# Patient Record
Sex: Male | Born: 1960 | Race: Black or African American | Hispanic: No | Marital: Married | Smoking: Current every day smoker
Health system: Southern US, Community
[De-identification: ages and names within clinical notes are randomized; demographics above are authoritative.]

## PROBLEM LIST (undated history)

## (undated) DIAGNOSIS — K219 Gastro-esophageal reflux disease without esophagitis: Secondary | ICD-10-CM

## (undated) DIAGNOSIS — D649 Anemia, unspecified: Secondary | ICD-10-CM

## (undated) DIAGNOSIS — I739 Peripheral vascular disease, unspecified: Secondary | ICD-10-CM

## (undated) DIAGNOSIS — Z8619 Personal history of other infectious and parasitic diseases: Secondary | ICD-10-CM

## (undated) HISTORY — DX: Peripheral vascular disease, unspecified: I73.9

## (undated) HISTORY — DX: Anemia, unspecified: D64.9

## (undated) HISTORY — DX: Gastro-esophageal reflux disease without esophagitis: K21.9

---

## 2002-10-25 HISTORY — PX: OTHER SURGICAL HISTORY: SHX169

## 2004-08-16 ENCOUNTER — Emergency Department: Payer: Self-pay | Admitting: Emergency Medicine

## 2006-04-14 ENCOUNTER — Ambulatory Visit: Payer: Self-pay | Admitting: Unknown Physician Specialty

## 2006-04-24 ENCOUNTER — Ambulatory Visit: Payer: Self-pay | Admitting: Unknown Physician Specialty

## 2006-05-25 ENCOUNTER — Ambulatory Visit: Payer: Self-pay | Admitting: Unknown Physician Specialty

## 2006-06-25 ENCOUNTER — Ambulatory Visit: Payer: Self-pay | Admitting: Unknown Physician Specialty

## 2006-12-14 ENCOUNTER — Ambulatory Visit: Payer: Self-pay | Admitting: Family Medicine

## 2006-12-23 ENCOUNTER — Ambulatory Visit: Payer: Self-pay | Admitting: Family Medicine

## 2006-12-23 LAB — CONVERTED CEMR LAB
ALT: 28 units/L (ref 0–40)
AST: 26 units/L (ref 0–37)
Albumin: 3.9 g/dL (ref 3.5–5.2)
Alkaline Phosphatase: 75 units/L (ref 39–117)
BUN: 12 mg/dL (ref 6–23)
Bilirubin, Direct: 0.1 mg/dL (ref 0.0–0.3)
CO2: 30 meq/L (ref 19–32)
Calcium: 9 mg/dL (ref 8.4–10.5)
Chloride: 100 meq/L (ref 96–112)
Cholesterol: 125 mg/dL (ref 0–200)
Creatinine, Ser: 1.1 mg/dL (ref 0.4–1.5)
Folate: 8.3 ng/mL
GFR calc Af Amer: 94 mL/min
GFR calc non Af Amer: 78 mL/min
Glucose, Bld: 78 mg/dL (ref 70–99)
HDL: 38.3 mg/dL — ABNORMAL LOW (ref >39.0)
LDL Cholesterol: 78 mg/dL (ref 0–99)
Lipase: 18 units/L (ref 11.0–59.0)
Potassium: 4 meq/L (ref 3.5–5.1)
Sodium: 135 meq/L (ref 135–145)
TSH: 0.95 microintl units/mL (ref 0.35–5.50)
Total Bilirubin: 1.1 mg/dL (ref 0.3–1.2)
Total CHOL/HDL Ratio: 3.3
Total Protein: 7.1 g/dL (ref 6.0–8.3)
Triglycerides: 42 mg/dL (ref 0–149)
VLDL: 8 mg/dL (ref 0–40)
Vitamin B-12: 216 pg/mL (ref 211–911)

## 2007-06-09 ENCOUNTER — Encounter: Payer: Self-pay | Admitting: Family Medicine

## 2007-06-09 DIAGNOSIS — K219 Gastro-esophageal reflux disease without esophagitis: Secondary | ICD-10-CM | POA: Insufficient documentation

## 2007-06-09 DIAGNOSIS — I739 Peripheral vascular disease, unspecified: Secondary | ICD-10-CM | POA: Insufficient documentation

## 2007-06-09 DIAGNOSIS — F1410 Cocaine abuse, uncomplicated: Secondary | ICD-10-CM | POA: Insufficient documentation

## 2007-06-14 ENCOUNTER — Encounter (INDEPENDENT_AMBULATORY_CARE_PROVIDER_SITE_OTHER): Payer: Self-pay | Admitting: *Deleted

## 2008-01-11 ENCOUNTER — Ambulatory Visit: Payer: Self-pay | Admitting: Family Medicine

## 2008-01-11 ENCOUNTER — Encounter (INDEPENDENT_AMBULATORY_CARE_PROVIDER_SITE_OTHER): Payer: Self-pay | Admitting: *Deleted

## 2008-10-13 ENCOUNTER — Telehealth: Payer: Self-pay | Admitting: Family Medicine

## 2008-10-13 ENCOUNTER — Emergency Department: Payer: Self-pay | Admitting: Emergency Medicine

## 2009-01-19 ENCOUNTER — Emergency Department (HOSPITAL_COMMUNITY): Admission: EM | Admit: 2009-01-19 | Discharge: 2009-01-19 | Payer: Self-pay | Admitting: Emergency Medicine

## 2009-10-28 LAB — HM COLONOSCOPY: HM Colonoscopy: NORMAL

## 2009-11-05 ENCOUNTER — Inpatient Hospital Stay: Payer: Self-pay | Admitting: Internal Medicine

## 2010-06-14 ENCOUNTER — Emergency Department: Payer: Self-pay | Admitting: Emergency Medicine

## 2010-07-26 ENCOUNTER — Ambulatory Visit: Payer: Self-pay | Admitting: Family Medicine

## 2010-10-25 HISTORY — PX: COLONOSCOPY: SHX174

## 2010-12-25 ENCOUNTER — Encounter: Payer: Self-pay | Admitting: Internal Medicine

## 2010-12-25 ENCOUNTER — Ambulatory Visit (INDEPENDENT_AMBULATORY_CARE_PROVIDER_SITE_OTHER): Payer: Managed Care, Other (non HMO) | Admitting: Internal Medicine

## 2010-12-25 ENCOUNTER — Other Ambulatory Visit: Payer: Managed Care, Other (non HMO)

## 2010-12-25 ENCOUNTER — Other Ambulatory Visit: Payer: Self-pay | Admitting: Internal Medicine

## 2010-12-25 DIAGNOSIS — R300 Dysuria: Secondary | ICD-10-CM

## 2010-12-25 DIAGNOSIS — E559 Vitamin D deficiency, unspecified: Secondary | ICD-10-CM

## 2010-12-25 DIAGNOSIS — N401 Enlarged prostate with lower urinary tract symptoms: Secondary | ICD-10-CM

## 2010-12-25 DIAGNOSIS — D649 Anemia, unspecified: Secondary | ICD-10-CM

## 2010-12-25 DIAGNOSIS — K219 Gastro-esophageal reflux disease without esophagitis: Secondary | ICD-10-CM

## 2010-12-25 DIAGNOSIS — N138 Other obstructive and reflux uropathy: Secondary | ICD-10-CM

## 2010-12-25 DIAGNOSIS — F17200 Nicotine dependence, unspecified, uncomplicated: Secondary | ICD-10-CM

## 2010-12-25 LAB — URINALYSIS, ROUTINE W REFLEX MICROSCOPIC
Bilirubin Urine: NEGATIVE
Hgb urine dipstick: NEGATIVE
Leukocytes, UA: NEGATIVE
Nitrite: NEGATIVE
Specific Gravity, Urine: 1.025 (ref 1.000–1.030)
Total Protein, Urine: NEGATIVE
Urine Glucose: NEGATIVE
Urobilinogen, UA: 0.2 (ref 0.0–1.0)
pH: 6.5 (ref 5.0–8.0)

## 2010-12-25 LAB — HEPATIC FUNCTION PANEL
ALT: 28 U/L (ref 0–53)
AST: 29 U/L (ref 0–37)
Albumin: 4.3 g/dL (ref 3.5–5.2)
Alkaline Phosphatase: 79 U/L (ref 39–117)
Bilirubin, Direct: 0.1 mg/dL (ref 0.0–0.3)
Total Bilirubin: 0.7 mg/dL (ref 0.3–1.2)
Total Protein: 7.5 g/dL (ref 6.0–8.3)

## 2010-12-25 LAB — BASIC METABOLIC PANEL
BUN: 13 mg/dL (ref 6–23)
CO2: 32 mEq/L (ref 19–32)
Calcium: 9.4 mg/dL (ref 8.4–10.5)
Chloride: 104 mEq/L (ref 96–112)
Creatinine, Ser: 1 mg/dL (ref 0.4–1.5)
GFR: 99.16 mL/min (ref >60.00)
Glucose, Bld: 89 mg/dL (ref 70–99)
Potassium: 4.3 mEq/L (ref 3.5–5.1)
Sodium: 141 mEq/L (ref 135–145)

## 2010-12-25 LAB — IBC PANEL
Iron: 116 ug/dL (ref 42–165)
Saturation Ratios: 28.7 % (ref 20.0–50.0)
Transferrin: 288.8 mg/dL (ref 212.0–360.0)

## 2010-12-25 LAB — TSH: TSH: 1.39 u[IU]/mL (ref 0.35–5.50)

## 2010-12-25 LAB — PSA: PSA: 0.67 ng/mL (ref 0.10–4.00)

## 2010-12-26 ENCOUNTER — Encounter: Payer: Self-pay | Admitting: Internal Medicine

## 2010-12-28 LAB — CBC WITH DIFFERENTIAL/PLATELET
Basophils Relative: 0.4 % (ref 0.0–3.0)
Eosinophils Relative: 1.6 % (ref 0.0–5.0)
HCT: 42.4 % (ref 39.0–52.0)
Hemoglobin: 14.3 g/dL (ref 13.0–17.0)
Lymphs Abs: 2.5 10*3/uL (ref 0.7–4.0)
Monocytes Relative: 7.3 % (ref 3.0–12.0)
Neutro Abs: 5.6 10*3/uL (ref 1.4–7.7)
WBC: 8.9 10*3/uL (ref 4.5–10.5)

## 2010-12-29 ENCOUNTER — Telehealth: Payer: Self-pay | Admitting: Internal Medicine

## 2010-12-30 ENCOUNTER — Telehealth: Payer: Self-pay | Admitting: Internal Medicine

## 2010-12-31 NOTE — Assessment & Plan Note (Signed)
Summary: NEW [employer] PT--#--PKG--STC   Vital Signs:  Patient profile:   50 year old male Height:      73 inches (185.42 cm) Weight:      180.50 pounds (82.05 kg) BMI:     23.90 O2 Sat:      98 % on Room air Temp:     98.0 degrees F (36.67 degrees C) oral Pulse rate:   66 / minute Pulse rhythm:   regular Resp:     14 per minute BP sitting:   118 / 78  (left arm) Cuff size:   large  Vitals Entered By: Burnard Leigh CMA(AAMA) (December 25, 2010 4:28 PM)  O2 Flow:  Room air CC: NP to establish.Discuss prostate & urinary issues x1 month/sls, cma, Preventive Care, Dysuria,  Is Patient Diabetic? No Pain Assessment Patient in pain? no        Primary Care Provider:  Etta Grandchild MD  CC:  NP to establish.Discuss prostate & urinary issues x1 month/sls, cma, Preventive Care, Dysuria, and .  History of Present Illness:  Dysuria      This is a 50 year old man who presents with Dysuria.  The symptoms began 4-8 weeks ago.  The intensity is described as moderate.  The patient reports burning with urination, but denies urinary frequency, urgency, hematuria, and penile discharge.  Associated symptoms include pelvic pain.  The patient denies the following associated symptoms: nausea, vomiting, fever, shaking chills, flank pain, abdominal pain, back pain, and arthralgias.  History is significant for no urinary tract problems.    Dyspepsia History:      He has no alarm features of dyspepsia including no history of melena, hematochezia, dysphagia, persistent vomiting, or involuntary weight loss > 5%.  There is a prior history of GERD.  The patient does not have a prior history of documented ulcer disease.  The dominant symptom is heartburn or acid reflux.  An H-2 blocker medication is currently being taken.  He notes that the symptoms have improved with the H-2 blocker therapy.  Symptoms have not persisted after 4 weeks of H-2 blocker treatment.  A prior EGD has been done which showed no evidence for  moderate or severe esophagitis or Barrett's esophagus.    Preventive Screening-Counseling & Management  Alcohol-Tobacco     Alcohol drinks/day: 1     Alcohol type: beer     >5/day in last 3 mos: no     Alcohol Counseling: not indicated; use of alcohol is not excessive or problematic     Feels need to cut down: no     Feels annoyed by complaints: no     Feels guilty re: drinking: no     Needs 'eye opener' in am: no     Smoking Status: current     Smoking Cessation Counseling: yes     Smoke Cessation Stage: precontemplative     Packs/Day: 0.5     Year Started: 1986     Cigars/week: 20     Tobacco Counseling: to quit use of tobacco products  Hep-HIV-STD-Contraception     Hepatitis Risk: no risk noted     HIV Risk: no risk noted     STD Risk: no risk noted     STD Risk Counseling: to avoid increased STD risk      Sexual History:  currently monogamous.        Drug Use:  former and crack cocaine.        Blood Transfusions:  yes and after 2001.    Current Medications (verified): 1)  Nexium 40 Mg Cpdr (Esomeprazole Magnesium) .... (1) Once Daily 2)  Vitamin D3 .... (1) Once Daily 3)  Vitamin B12 .... (1) Once Daily  Allergies (verified): No Known Drug Allergies  Past History:  Past Medical History: GERD Peripheral vascular disease Anemia-NOS  Past Surgical History: Reviewed history from 06/09/2007 and no changes required. 2004   Fatty tissue removed from neck  Social History: Hepatitis Risk:  no risk noted HIV Risk:  no risk noted STD Risk:  no risk noted Sexual History:  currently monogamous Drug Use:  former, crack cocaine Blood Transfusions:  yes, after 2001 Smoking Status:  current Packs/Day:  0.5  Review of Systems  The patient denies anorexia, fever, weight loss, weight gain, chest pain, syncope, dyspnea on exertion, peripheral edema, prolonged cough, headaches, hemoptysis, abdominal pain, hematuria, incontinence, genital sores, muscle weakness,  suspicious skin lesions, transient blindness, difficulty walking, depression, and enlarged lymph nodes.   GI:  Denies abdominal pain, bloody stools, change in bowel habits, constipation, dark tarry stools, diarrhea, excessive appetite, gas, hemorrhoids, indigestion, loss of appetite, nausea, vomiting, vomiting blood, and yellowish skin color. GU:  Complains of dysuria and urinary hesitancy; denies decreased libido, discharge, erectile dysfunction, genital sores, hematuria, incontinence, nocturia, and urinary frequency. Heme:  Denies abnormal bruising, bleeding, enlarge lymph nodes, fevers, pallor, and skin discoloration.  Physical Exam  General:  alert, well-developed, well-nourished, well-hydrated, appropriate dress, normal appearance, healthy-appearing, cooperative to examination, and good hygiene.   Head:  normocephalic, atraumatic, no abnormalities observed, and no abnormalities palpated.   Eyes:  vision grossly intact, pupils equal, pupils round, and pupils reactive to light.   Ears:  External ear exam shows no significant lesions or deformities.  Otoscopic examination reveals clear canals, tympanic membranes are intact bilaterally without bulging, retraction, inflammation or discharge. Hearing is grossly normal bilaterally. Nose:  External nasal examination shows no deformity or inflammation. Nasal mucosa are pink and moist without lesions or exudates. Mouth:  he has a beefy red tongue. good dentition, pharynx pink and moist, no exudates, no posterior lymphoid hypertrophy, no postnasal drip, no pharyngeal crowing, no lesions, no aphthous ulcers, no erosions, no leukoplakia, and no petechiae.   Neck:  supple, full ROM, no masses, no thyromegaly, no thyroid nodules or tenderness, no JVD, normal carotid upstroke, no carotid bruits, no cervical lymphadenopathy, and no neck tenderness.   Lungs:  normal respiratory effort, no intercostal retractions, no accessory muscle use, normal breath sounds, no  dullness, no fremitus, no crackles, and no wheezes.   Heart:  normal rate, regular rhythm, no murmur, no gallop, no rub, and no JVD.   Abdomen:  soft, non-tender, normal bowel sounds, no distention, no masses, no guarding, no rigidity, no rebound tenderness, no abdominal hernia, no inguinal hernia, no hepatomegaly, and no splenomegaly.   Rectal:  No external abnormalities noted. Normal sphincter tone. No rectal masses or tenderness. Genitalia:  circumcised, no hydrocele, no varicocele, no scrotal masses, no testicular masses or atrophy, no cutaneous lesions, and no urethral discharge.   Prostate:  no nodules, no asymmetry, no induration, boggy, and 1+ enlarged.   Msk:  No deformity or scoliosis noted of thoracic or lumbar spine.   Pulses:  R and L carotid,radial,femoral,dorsalis pedis and posterior tibial pulses are full and equal bilaterally Extremities:  No clubbing, cyanosis, edema, or deformity noted with normal full range of motion of all joints.   Neurologic:  No cranial nerve deficits noted. Station  and gait are normal. Plantar reflexes are down-going bilaterally. DTRs are symmetrical throughout. Sensory, motor and coordinative functions appear intact. Skin:  Intact without suspicious lesions or rashes Cervical Nodes:  No lymphadenopathy noted Axillary Nodes:  No palpable lymphadenopathy Inguinal Nodes:  No significant adenopathy Psych:  Cognition and judgment appear intact. Alert and cooperative with normal attention span and concentration. No apparent delusions, illusions, hallucinations   Impression & Recommendations:  Problem # 1:  DYSURIA (ICD-788.1) Assessment New this sounds like prostatitis His updated medication list for this problem includes:    Cipro 500 Mg Tabs (Ciprofloxacin hcl) ..... One by mouth two times a day for 30 days  Orders: Venipuncture (96295) TLB-B12 + Folate Pnl (28413_24401-U27/OZD) TLB-IBC Pnl (Iron/FE;Transferrin) (83550-IBC) TLB-BMP (Basic Metabolic  Panel-BMET) (80048-METABOL) TLB-CBC Platelet - w/Differential (85025-CBCD) TLB-Hepatic/Liver Function Pnl (80076-HEPATIC) TLB-TSH (Thyroid Stimulating Hormone) (84443-TSH) TLB-PSA (Prostate Specific Antigen) (84153-PSA) TLB-Udip w/ Micro (81001-URINE) T-Chlamydia  Probe, urine (66440-34742) T-GC Probe, urine [phone]) T-Urine Culture (Spectrum Order) [phone]) T-Vitamin D (25-Hydroxy) [phone])  Problem # 2:  VITAMIN D DEFICIENCY (ICD-268.9) Assessment: Unchanged  Orders: Venipuncture (09323) TLB-B12 + Folate Pnl (55732_20254-Y70/WCB) TLB-IBC Pnl (Iron/FE;Transferrin) (83550-IBC) TLB-BMP (Basic Metabolic Panel-BMET) (80048-METABOL) TLB-CBC Platelet - w/Differential (85025-CBCD) TLB-Hepatic/Liver Function Pnl (80076-HEPATIC) TLB-TSH (Thyroid Stimulating Hormone) (84443-TSH) TLB-PSA (Prostate Specific Antigen) (84153-PSA) TLB-Udip w/ Micro (81001-URINE) T-Chlamydia  Probe, urine (76283-15176) T-GC Probe, urine [phone]) T-Urine Culture (Spectrum Order) [id number]) T-Vitamin D (25-Hydroxy) ([phone])  Problem # 3:  ANEMIA-NOS (ICD-285.9) Assessment: Unchanged  Orders: Venipuncture (99371) TLB-B12 + Folate Pnl (69678_93810-F75/ZWC) TLB-IBC Pnl (Iron/FE;Transferrin) (83550-IBC) TLB-BMP (Basic Metabolic Panel-BMET) (80048-METABOL) TLB-CBC Platelet - w/Differential (85025-CBCD) TLB-Hepatic/Liver Function Pnl (80076-HEPATIC) TLB-TSH (Thyroid Stimulating Hormone) (84443-TSH) TLB-PSA (Prostate Specific Antigen) (84153-PSA) TLB-Udip w/ Micro (81001-URINE) T-Chlamydia  Probe, urine ([phone]) T-GC Probe, urine [phone]) T-Urine Culture (Spectrum Order) [phone]) T-Vitamin D (25-Hydroxy) ([phone]) Hemoccult Guaiac-1 spec.(in office) [phone])  B12: 216 (12/23/2006)   Folate: 8.3 (12/23/2006)   TSH: 0.95 (12/23/2006)  Problem # 4:  HYPERTROPHY PROSTATE W/UR OBST & OTH LUTS (ICD-600.01) Assessment: New  Orders: Venipuncture (33825) TLB-B12 +  Folate Pnl (05397_67341-P37/TKW) TLB-IBC Pnl (Iron/FE;Transferrin) (83550-IBC) TLB-BMP (Basic Metabolic Panel-BMET) (80048-METABOL) TLB-CBC Platelet - w/Differential (85025-CBCD) TLB-Hepatic/Liver Function Pnl (80076-HEPATIC) TLB-TSH (Thyroid Stimulating Hormone) (84443-TSH) TLB-PSA (Prostate Specific Antigen) (84153-PSA) TLB-Udip w/ Micro (81001-URINE) T-Chlamydia  Probe, urine (40973-53299) T-GC Probe, urine [phone]) T-Urine Culture (Spectrum Order) [phone]) T-Vitamin D (25-Hydroxy) [phone]) Tobacco use cessation intermediate 3-10 minutes (48185)  Problem # 5:  GERD (ICD-530.81) Assessment: Improved  His updated medication list for this problem includes:    Nexium 40 Mg Cpdr (Esomeprazole magnesium) ..... (1) once daily  Orders: Venipuncture (63149) TLB-B12 + Folate Pnl (70263_78588-F02/DXA) TLB-IBC Pnl (Iron/FE;Transferrin) (83550-IBC) TLB-BMP (Basic Metabolic Panel-BMET) (80048-METABOL) TLB-CBC Platelet - w/Differential (85025-CBCD) TLB-Hepatic/Liver Function Pnl (80076-HEPATIC) TLB-TSH (Thyroid Stimulating Hormone) (84443-TSH) TLB-PSA (Prostate Specific Antigen) (84153-PSA) TLB-Udip w/ Micro (81001-URINE) T-Chlamydia  Probe, urine (12878-67672) T-GC Probe, urine ([phone]) T-Urine Culture (Spectrum Order) [phone]) T-Vitamin D (25-Hydroxy) [phone]) Hemoccult Guaiac-1 spec.(in office) (82270) Tobacco use cessation intermediate 3-10 minutes (27517)  Problem # 6:  TOBACCO USE (ICD-305.1) Assessment: New  Encouraged smoking cessation and discussed different methods for smoking cessation.   Orders: Tobacco use cessation intermediate 3-10 minutes (99406)  Complete Medication List: 1)  Nexium 40 Mg Cpdr (Esomeprazole magnesium) .... (1) once daily 2)  Vitamin D3  .... (1) once daily 3)  Vitamin B12  .... (1) once daily 4)  Cipro 500 Mg Tabs (Ciprofloxacin hcl) .... One by mouth two times a day for 30 days  Colorectal  Screening:  Current  Recommendations:    Hemoccult: NEG X 1 today  Colonoscopy Results:    Date of Exam: 10/28/2009    Results: Normal  Colonoscopy Comments:    at [facility]  PSA Screening:    Reviewed PSA screening recommendations: PSA ordered   Patient Instructions: 1)  Please schedule a follow-up appointment in 1 month. 2)  Avoid foods high in acid (tomatoes, citrus juices, spicy foods). Avoid eating within two hours of lying down or before exercising. Do not over eat; try smaller more frequent meals. Elevate head of bed twelve inches when sleeping. 3)  Tobacco is very bad for your health and your loved ones! You Should stop smoking!. 4)  Stop Smoking Tips: Choose a Quit date. Cut down before the Quit date. decide what you will do as a substitute when you feel the urge to smoke(gum,toothpick,exercise). 5)  Take your antibiotic as prescribed until ALL of it is gone, but stop if you develop a rash or swelling and contact our office as soon as possible. Prescriptions: CIPRO 500 MG TABS (CIPROFLOXACIN HCL) One by mouth two times a day for 30 days  #60 x 1   Entered and Authorized by:   Etta Grandchild MD   Signed by:   Etta Grandchild MD on 12/25/2010   Method used:   Electronically to        [facility] [phone]* (retail)       [address] [place], [place]  [address]       Ph: [phone] or [phone]       Fax: [phone]   RxID:   [id number]    Orders Added: 1)  Venipuncture [34742] 2)  TLB-B12 + Folate Pnl [82746_82607-B12/FOL] 3)  TLB-IBC Pnl (Iron/FE;Transferrin) [83550-IBC] 4)  TLB-BMP (Basic Metabolic Panel-BMET) [80048-METABOL] 5)  TLB-CBC Platelet - w/Differential [85025-CBCD] 6)  TLB-Hepatic/Liver Function Pnl [80076-HEPATIC] 7)  TLB-TSH (Thyroid Stimulating Hormone) [84443-TSH] 8)  TLB-PSA (Prostate Specific Antigen) [84153-PSA] 9)  TLB-Udip w/ Micro [81001-URINE] 10)  T-Chlamydia  Probe, urine [phone] 11)  T-GC Probe, urine [phone] 12)   T-Urine Culture (Spectrum Order) [66063-01601] 13)  T-Vitamin D (25-Hydroxy) [phone] 14)  Hemoccult Guaiac-1 spec.(in office) [82270] 15)  Tobacco use cessation intermediate 3-10 minutes [99406] 16)  New Patient Level V [99205]

## 2011-01-05 NOTE — Progress Notes (Signed)
Summary: Med ?  Phone Note Call from Patient Call back at [facility] Phone [phone]   Summary of Call: Male called for pt - req a call, has question about med MD prescribed. Initial call taken by: Lamar Sprinkles, CMA,  December 29, 2010 4:17 PM  Follow-up for Phone Call        Spoke w/pt 1.Pt c/o loose yellow stools 1 - 2 daily and rectal itching. Started antibiotic on Friday. Pt thinks symptoms are from new med.  2. Req results of labs. Follow-up by: Lamar Sprinkles, CMA,  December 29, 2010 6:20 PM  Additional Follow-up for Phone Call Additional follow up Details #1::        vitamin d level is low other labs look good stop antibiotic and follow-up Additional Follow-up by: Etta Grandchild MD,  December 29, 2010 6:24 PM    Additional Follow-up for Phone Call Additional follow up Details #2::    Pt informed  Follow-up by: Lamar Sprinkles, CMA,  December 30, 2010 4:14 PM

## 2011-01-05 NOTE — Progress Notes (Signed)
  Phone Note Call from Patient   Summary of Call: Pt is concerned why MD stopped antibiotic. Culture showed insignifigant growth so he does not need any further meds correct?  Initial call taken by: Lamar Sprinkles, CMA,  December 30, 2010 5:02 PM  Follow-up for Phone Call        antibiotic was stopped due to side effects, his urine did not show any signs of infection Follow-up by: Etta Grandchild MD,  December 30, 2010 5:07 PM  Additional Follow-up for Phone Call Additional follow up Details #1::        Pt informed  Additional Follow-up by: Lamar Sprinkles, CMA,  December 30, 2010 5:27 PM

## 2011-01-27 ENCOUNTER — Ambulatory Visit: Payer: Managed Care, Other (non HMO) | Admitting: Internal Medicine

## 2011-01-27 DIAGNOSIS — Z0289 Encounter for other administrative examinations: Secondary | ICD-10-CM

## 2011-03-12 NOTE — Assessment & Plan Note (Signed)
[facility]                           [place] OFFICE NOTE   Donald Austin, Donald Austin                         MRN:          [id number]  DATE:12/14/2006                            DOB:          [birth year]    CHIEF COMPLAINT:  A 50 year old black male here to establish new doctor.   HISTORY OF PRESENT ILLNESS:  Donald Austin states that he had been feeling  well previously until about 2-3 months ago when he began having dry  mouth and some increased thirst.  He is concerned about the way his  inner mouth looks and about the large taste buds at the base of his  tongue.  He denies polyuria.  He does also have some fatigue and  generalized weakness over the past few months.   He has had some chronic issues as well with abdominal soreness over the  epigastric region.  He has symptoms of heartburn and frequent belching.  He states his epigastric pain is worse with onions and orange juice.  He  does state he has a history of peptic ulcer disease.  He denies any  blood in his stool.  He denies any unexpected weight loss or night  sweats.   REVIEW OF SYSTEMS:  Otherwise negative.   PAST MEDICAL HISTORY:  1. Gastroesophageal reflux disease.  2. Peptic ulcer disease.  3. Crack dependence up until June 2007.   HOSPITALIZATIONS, SURGERY, AND PROCEDURES:  In 2004 fatty tissue removed  from neck, possible lipoma.   ALLERGIES:  NONE.   MEDICATIONS:  None.   SOCIAL HISTORY:  He is a former smoker with 20 pack/year history and  states he quit 4 days ago.  He occasionally drinks alcohol, 3-4 twelve  ounce beers on the weekends.  He has used crack cocaine in the past, but  states he only used injected drugs once.  He currently works in Financial planner at [employer].  He is married, he has 3 healthy children.  He  does not get regular exercise.  He avoids fried foods and usually eats  baked foods as well as fruits and vegetables.   FAMILY HISTORY:  Father deceased  at age 67 with hypertension and kidney  failure.  Mother alive at age 57 with hypertension.  No family history  of heart attack before 48.  He has one sister who had breast cancer at  age 75 and is now recovering.  There is no other type of cancer in the  family.   PHYSICAL EXAMINATION:  VITAL SIGNS:  Height 73.5 inches, weight 179.2,  blood pressure 122/90, pulse 80, temperature 98.1.  GENERAL:  Healthy appearing male in no apparent distress.  HEENT:  PERRLA, extraocular muscles intact, oropharynx clear, mucus  membranes moist, normal salivary gland openings, normal taste buds,  there is some slight geographic tongue as well as some bite marks on the  sides of his mouth near his molars.  He does have poor dentition and a  broken tooth on the right posterior side of his mouth.  Nares clear,  tympanic membranes clear, no thyromegaly, no  lymphadenopathy  supraclavicular or cervical.  CARDIOVASCULAR:  Regular rate and rhythm, no murmurs, rubs, or gallops.  ABDOMEN:  Soft, mild epigastric tenderness to palpation but otherwise  nontender.  Normoactive bowel sounds, no hepatosplenomegaly.  MUSCULOSKELETAL: Strength 5/5 in upper and lower extremities.  NEURO:  Cranial nerves II-XII grossly intact, alert and oriented x4.   ASSESSMENT/PLAN:  1. Dry mouth, increased thirst:  We will evaluate him for diabetes      given his symptoms.  I currently do not see any worrisome oral      lesions except for some areas where he is biting the sides of his      mouth.  We can followup on these at the next visit.  2. Fatigue:  His fatigue has been ongoing for 2-3 months.  I will      check a B-12 folate electrolyte panel as well as thyroid function.  3. Epigastric pain:  This is most likely secondary to gastritis versus      gastroesophageal reflux disease.  There is a possibility there is      peptic ulcer disease and he may need gastrointestinal workup.  At      this point in time I will evaluate with  liver function, lipase, as      well as the previously mentioned labs.  He will start taking      Prilosec 40 mg daily.  He will return in approximately 2-3 weeks to      follow up.  He will also avoid foods that irritate his stomach      until that point in time.  4. Prevention:  He is due for a cholesterol screen which we will have      him return for fasting with the other labs.  He is Unsure as to      when last tetanus was done.  We did discuss continuing smoking      cessation and he was encouraged to do so.  We can talk about any      medications if he restarts smoking.  He was encouraged to get      regular exercise, at least 3 days times a week for 30 minutes at a      time.  He was encouraged to avoid restarting crack.  Given his one      use of injected drugs, we can consider checking for HIV and      hepatitis at a later date.     Kerby Nora, MD  Electronically Signed    AB/MedQ  DD: 12/15/2006  DT: 12/15/2006  Job #: [id number]

## 2011-05-06 ENCOUNTER — Encounter: Payer: Self-pay | Admitting: Internal Medicine

## 2011-05-13 ENCOUNTER — Ambulatory Visit: Payer: Managed Care, Other (non HMO) | Admitting: Internal Medicine

## 2011-05-13 DIAGNOSIS — Z0289 Encounter for other administrative examinations: Secondary | ICD-10-CM

## 2011-07-03 ENCOUNTER — Emergency Department: Payer: Self-pay | Admitting: Internal Medicine

## 2011-12-14 ENCOUNTER — Encounter: Payer: Managed Care, Other (non HMO) | Admitting: Family Medicine

## 2012-01-27 ENCOUNTER — Ambulatory Visit: Payer: Managed Care, Other (non HMO) | Admitting: Internal Medicine

## 2012-03-21 ENCOUNTER — Ambulatory Visit: Payer: Managed Care, Other (non HMO) | Admitting: Internal Medicine

## 2012-03-21 DIAGNOSIS — Z0289 Encounter for other administrative examinations: Secondary | ICD-10-CM

## 2012-11-22 ENCOUNTER — Ambulatory Visit: Payer: Managed Care, Other (non HMO) | Admitting: Internal Medicine

## 2013-11-19 ENCOUNTER — Ambulatory Visit: Payer: Managed Care, Other (non HMO) | Admitting: Internal Medicine

## 2013-11-19 DIAGNOSIS — Z0289 Encounter for other administrative examinations: Secondary | ICD-10-CM

## 2015-06-05 ENCOUNTER — Ambulatory Visit (INDEPENDENT_AMBULATORY_CARE_PROVIDER_SITE_OTHER): Payer: Managed Care, Other (non HMO) | Admitting: Cardiovascular Disease

## 2015-06-05 ENCOUNTER — Encounter: Payer: Managed Care, Other (non HMO) | Admitting: Cardiovascular Disease

## 2015-06-05 ENCOUNTER — Encounter: Payer: Self-pay | Admitting: Cardiovascular Disease

## 2015-06-05 VITALS — BP 142/88 | HR 63 | Ht 74.0 in | Wt 181.0 lb

## 2015-06-05 DIAGNOSIS — I739 Peripheral vascular disease, unspecified: Secondary | ICD-10-CM

## 2015-06-05 DIAGNOSIS — M79606 Pain in leg, unspecified: Secondary | ICD-10-CM

## 2015-06-05 NOTE — Progress Notes (Signed)
06/05/2015 Donald Austin   [birth year]  [id number]  Primary Physician Scarlette Calico, MD Primary Cardiologist: Lorretta Harp MD Renae Gloss   HPI:  Donald Austin is a 54 year old thin-appearing married African-American male father of 3 children who works at [employer]. His cardiac risk factor profile is notable for tobacco abuse smoking one half pack a day for last 20+ years. Does drink 2 beers a day. He has no other risk factors for cardiovascular disease. He has never had a heart attack or stroke. He denies chest pain but does get some dyspnea on exertion. He saw his podiatrist, Dr. Melony Overly , because of pain and she referred him here for peripheral vascular evaluation because of absent pedal pulses.   Current Outpatient Prescriptions  Medication Sig Dispense Refill  . Cholecalciferol (VITAMIN D-3 PO) Take 1 tablet by mouth daily.      Marland Kitchen esomeprazole (NEXIUM) 40 MG capsule Take 40 mg by mouth daily.      . vitamin B-12 (CYANOCOBALAMIN) 1000 MCG tablet Take 1,000 mcg by mouth daily.       No current facility-administered medications for this visit.    Not on File  Social History   Social History  . Marital Status: Married    Spouse Name: N/A  . Number of Children: 3  . Years of Education: N/A   Occupational History  . Tour manager   Social History Main Topics  . Smoking status: Current Every Day Smoker -- 0.50 packs/day    Types: Cigarettes  . Smokeless tobacco: Not on file     Comment: quit 4 days ago, 20pyh  . Alcohol Use: 3.6 oz/week    6 Standard drinks or equivalent per week     Comment: occasionally (3-4 12oz./weekends)  . Drug Use: No     Comment: no inj. drugs  . Sexual Activity: Not on file   Other Topics Concern  . Not on file   Social History Narrative   Regular exercise--no   Children 3 healthy   Diet: Avoids fried food, baked, occ. Fruits and veggies     Review of Systems: General: negative for chills,  fever, night sweats or weight changes.  Cardiovascular: negative for chest pain, dyspnea on exertion, edema, orthopnea, palpitations, paroxysmal nocturnal dyspnea or shortness of breath Dermatological: negative for rash Respiratory: negative for cough or wheezing Urologic: negative for hematuria Abdominal: negative for nausea, vomiting, diarrhea, bright red blood per rectum, melena, or hematemesis Neurologic: negative for visual changes, syncope, or dizziness All other systems reviewed and are otherwise negative except as noted above.    Blood pressure 142/88, pulse 63, height 6\' 2"  (1.88 m), weight 181 lb (82.101 kg).  General appearance: alert and no distress Neck: no adenopathy, no carotid bruit, no JVD, supple, symmetrical, trachea midline and thyroid not enlarged, symmetric, no tenderness/mass/nodules Lungs: clear to auscultation bilaterally Heart: regular rate and rhythm, S1, S2 normal, no murmur, click, rub or gallop Extremities: extremities normal, atraumatic, no cyanosis or edema and 2+ pedal pulses  EKG normal sinus rhythm at 63 with a ST or T-wave changes. I personally reviewed this EKG  ASSESSMENT AND PLAN:   Peripheral vascular disease Donald Austin was referred to me by Dr. Melony Overly for peripheral arterial disease. He complains of foot pain. Dr. Melony Overly did not feel pedal pulses and referred him here for further evaluation.he denies claudication. He has 2+ pedal pulses on exam. I'm going to get lower extremity ABIs.  Lorretta Harp MD FACP,FACC,FAHA, [facility] 06/05/2015 5:31 PM

## 2015-06-05 NOTE — Patient Instructions (Signed)
  We will see you back in follow up as needed.   Dr Gwenlyn Found has ordered: 1. ABI dopplers

## 2015-06-05 NOTE — Assessment & Plan Note (Signed)
Donald Austin was referred to me by Dr. Melony Overly for peripheral arterial disease. He complains of foot pain. Dr. Melony Overly did not feel pedal pulses and referred him here for further evaluation.he denies claudication. He has 2+ pedal pulses on exam. I'm going to get lower extremity ABIs.

## 2015-06-17 ENCOUNTER — Ambulatory Visit (HOSPITAL_COMMUNITY)
Admission: RE | Admit: 2015-06-17 | Discharge: 2015-06-17 | Disposition: A | Discharge status: Home / Self Care | Payer: Managed Care, Other (non HMO) | Source: Ambulatory Visit | Attending: Cardiovascular Disease | Admitting: Cardiovascular Disease

## 2015-06-17 DIAGNOSIS — R531 Weakness: Secondary | ICD-10-CM | POA: Insufficient documentation

## 2015-06-17 DIAGNOSIS — I10 Essential (primary) hypertension: Secondary | ICD-10-CM | POA: Insufficient documentation

## 2015-06-17 DIAGNOSIS — M79606 Pain in leg, unspecified: Secondary | ICD-10-CM | POA: Diagnosis not present

## 2015-06-23 ENCOUNTER — Encounter: Payer: Self-pay | Admitting: *Deleted

## 2015-07-14 ENCOUNTER — Encounter: Payer: Self-pay | Admitting: Emergency Medicine

## 2015-07-14 ENCOUNTER — Emergency Department
Admission: EM | Admit: 2015-07-14 | Discharge: 2015-07-15 | Disposition: A | Discharge status: Other Acute Inpatient Hospital | Payer: Managed Care, Other (non HMO) | Attending: Emergency Medicine | Admitting: Emergency Medicine

## 2015-07-14 DIAGNOSIS — Z720 Tobacco use: Secondary | ICD-10-CM | POA: Diagnosis not present

## 2015-07-14 DIAGNOSIS — F4321 Adjustment disorder with depressed mood: Secondary | ICD-10-CM | POA: Diagnosis not present

## 2015-07-14 DIAGNOSIS — Z79899 Other long term (current) drug therapy: Secondary | ICD-10-CM | POA: Diagnosis not present

## 2015-07-14 DIAGNOSIS — F1020 Alcohol dependence, uncomplicated: Secondary | ICD-10-CM

## 2015-07-14 DIAGNOSIS — F329 Major depressive disorder, single episode, unspecified: Secondary | ICD-10-CM | POA: Insufficient documentation

## 2015-07-14 DIAGNOSIS — F32A Depression, unspecified: Secondary | ICD-10-CM

## 2015-07-14 LAB — COMPREHENSIVE METABOLIC PANEL
ALBUMIN: 4.5 g/dL (ref 3.5–5.0)
ALK PHOS: 84 U/L (ref 38–126)
ALT: 29 U/L (ref 17–63)
ANION GAP: 9 (ref 5–15)
AST: 31 U/L (ref 15–41)
BILIRUBIN TOTAL: 1.1 mg/dL (ref 0.3–1.2)
BUN: 12 mg/dL (ref 6–20)
CALCIUM: 9.6 mg/dL (ref 8.9–10.3)
CO2: 26 mmol/L (ref 22–32)
Chloride: 103 mmol/L (ref 101–111)
Creatinine, Ser: 1.05 mg/dL (ref 0.61–1.24)
GFR calc Af Amer: >60 mL/min (ref >60)
GFR calc non Af Amer: >60 mL/min (ref >60)
GLUCOSE: 107 mg/dL — AB (ref 65–99)
Potassium: 4 mmol/L (ref 3.5–5.1)
Sodium: 138 mmol/L (ref 135–145)
TOTAL PROTEIN: 8.1 g/dL (ref 6.5–8.1)

## 2015-07-14 LAB — URINE DRUG SCREEN, QUALITATIVE (ARMC ONLY)
Amphetamines, Ur Screen: NOT DETECTED
BARBITURATES, UR SCREEN: NOT DETECTED
BENZODIAZEPINE, UR SCRN: NOT DETECTED
CANNABINOID 50 NG, UR ~~LOC~~: NOT DETECTED
Cocaine Metabolite,Ur ~~LOC~~: NOT DETECTED
MDMA (ECSTASY) UR SCREEN: NOT DETECTED
Methadone Scn, Ur: NOT DETECTED
Opiate, Ur Screen: NOT DETECTED
Phencyclidine (PCP) Ur S: NOT DETECTED
TRICYCLIC, UR SCREEN: NOT DETECTED

## 2015-07-14 LAB — CBC
HCT: 47.3 % (ref 40.0–52.0)
Hemoglobin: 16.1 g/dL (ref 13.0–18.0)
MCH: 29.8 pg (ref 26.0–34.0)
MCHC: 34 g/dL (ref 32.0–36.0)
MCV: 87.7 fL (ref 80.0–100.0)
PLATELETS: 225 10*3/uL (ref 150–440)
RBC: 5.39 MIL/uL (ref 4.40–5.90)
RDW: 12.9 % (ref 11.5–14.5)
WBC: 8 10*3/uL (ref 3.8–10.6)

## 2015-07-14 LAB — ETHANOL: Alcohol, Ethyl (B): <5 mg/dL (ref <5)

## 2015-07-14 MED ORDER — NICOTINE 10 MG IN INHA
1.0000 | RESPIRATORY_TRACT | Status: DC | PRN
  Administered 2015-07-14: 1 via RESPIRATORY_TRACT
  Filled 2015-07-14: qty 36

## 2015-07-14 MED ORDER — DIPHENHYDRAMINE HCL 25 MG PO CAPS
50.0000 mg | ORAL_CAPSULE | Freq: Once | ORAL | Status: AC
Start: 2015-07-14 — End: 2015-07-14
  Administered 2015-07-14: 50 mg via ORAL
  Filled 2015-07-14: qty 2

## 2015-07-14 NOTE — ED Notes (Signed)

## 2015-07-14 NOTE — ED Notes (Signed)
Meal tray given 

## 2015-07-14 NOTE — ED Provider Notes (Signed)
Time Seen: Approximately.----------------------------------------- 4:22 PM on 07/14/2015 -----------------------------------------    I have reviewed the triage notes  Chief Complaint: Depression and Alcohol Intoxication   History of Present Illness: Donald Austin is a 54 y.o. male who presents with feelings of depression with a history of alcohol abuse. Patient states that he's made recent mistakes in his personal and occupational life and is feeling a lot of depression and states he "" just wants the pain to go away "". He denies any feelings of suicide at this time he denies any hallucinations or homicidal thoughts. He states he recently had an affair and his wife has "" kicked him out of the house "". Patient states he is seeking help at this time because he "" doesn't have any place else to go "". He smokes occasional marijuana but otherwise denies any illicit drugs recently such as cocaine, heroin etc. He states he's also had some employment situation and states that he has some "" lawsuits against him "". He has some mild abdominal pain but states it's not severe and he thinks it may be due to his stomach being upset. He denies any nausea, vomiting, or diarrhea.   Past Medical History  Diagnosis Date  . GERD (gastroesophageal reflux disease)   . Anemia     NOS  . PVD (peripheral vascular disease)     Patient Active Problem List   Diagnosis Date Noted  . VITAMIN D DEFICIENCY 12/25/2010  . ANEMIA-NOS 12/25/2010  . TOBACCO USE 12/25/2010  . HYPERTROPHY PROSTATE W/UR OBST & OTH LUTS 12/25/2010  . ABUSE, COCAINE, UNSPECIFIED 06/09/2007  . Peripheral vascular disease 06/09/2007  . GERD 06/09/2007    Past Surgical History  Procedure Laterality Date  . Fatty tissue removed from neck  2004  . Colonoscopy  2012    Past Surgical History  Procedure Laterality Date  . Fatty tissue removed from neck  2004  . Colonoscopy  2012    Current Outpatient Rx  Name  Route   Sig  Dispense  Refill  . Cholecalciferol (VITAMIN D-3 PO)   Oral   Take 1 tablet by mouth daily.           Marland Kitchen esomeprazole (NEXIUM) 40 MG capsule   Oral   Take 40 mg by mouth daily.           . vitamin B-12 (CYANOCOBALAMIN) 1000 MCG tablet   Oral   Take 1,000 mcg by mouth daily.             Allergies:  Review of patient's allergies indicates no known allergies.  Family History: Family History  Problem Relation Age of Onset  . Hypertension Father   . Kidney failure Father   . Hypertension Mother   . Cancer Sister     breast  . Heart attack Neg Hx     <55  . Hypertension Sister   . Hypertension Sister   . Hypertension Brother   . Hypertension Brother   . Cancer Mother     Social History: Social History  Substance Use Topics  . Smoking status: Current Every Day Smoker -- 0.50 packs/day    Types: Cigarettes  . Smokeless tobacco: None     Comment: quit 4 days ago, 20pyh  . Alcohol Use: 3.6 oz/week    6 Standard drinks or equivalent per week     Comment: occasionally (3-4 12oz./weekends)     Review of Systems:   10 point review of systems was performed and  was otherwise negative:  Constitutional: No fever Eyes: No visual disturbances ENT: No sore throat, ear pain Cardiac: No chest pain Respiratory: No shortness of breath, wheezing, or stridor Abdomen: No abdominal pain, no vomiting, No diarrhea Endocrine: No weight loss, No night sweats Extremities: No peripheral edema, cyanosis Skin: No rashes, easy bruising Neurologic: No focal weakness, trouble with speech or swollowing Urologic: No dysuria, Hematuria, or urinary frequency   Physical Exam:  ED Triage Vitals  Enc Vitals Group     BP 07/14/15 1438 141/98 mmHg     Pulse Rate 07/14/15 1438 88     Resp 07/14/15 1438 20     Temp 07/14/15 1438 98.8 F (37.1 C)     Temp Source 07/14/15 1438 Oral     SpO2 07/14/15 1438 100 %     Weight 07/14/15 1438 175 lb (79.379 kg)     Height 07/14/15 1438 6'  (1.829 m)     Head Cir --      Peak Flow --      Pain Score 07/14/15 1439 0     Pain Loc --      Pain Edu? --      Excl. in [place]? --     General: Awake , Alert , and Oriented times 3; GCS 15 Head: Normal cephalic , atraumatic Eyes: Pupils equal , round, reactive to light Nose/Throat: No nasal drainage, patent upper airway without erythema or exudate.  Neck: Supple, Full range of motion, No anterior adenopathy or palpable thyroid masses Lungs: Clear to ascultation without wheezes , rhonchi, or rales Heart: Regular rate, regular rhythm without murmurs , gallops , or rubs Abdomen: Soft, non tender without rebound, guarding , or rigidity; bowel sounds positive and symmetric in all 4 quadrants. No organomegaly .        Extremities: 2 plus symmetric pulses. No edema, clubbing or cyanosis Neurologic: normal ambulation, Motor symmetric without deficits, sensory intact Skin: warm, dry, no rashes   Labs:   All laboratory work was reviewed including any pertinent negatives or positives listed below:  Labs Reviewed  COMPREHENSIVE METABOLIC PANEL - Abnormal; Notable for the following:    Glucose, Bld 107 (*)    All other components within normal limits  ETHANOL  CBC  URINE DRUG SCREEN, QUALITATIVE ([place])     Procedures:  Patient has been established for psychiatric consultation    ED Course:  The patient doesn't fit for involuntary commitment at this time as he frankly states he is not suicidal, homicidal, or hallucinations. Patient does need some psychiatric counseling as been encouraged to stay but at this time he is voluntary commitment. He seems to be medically cleared as he does not have any significant reproducible abdominal pain and I felt it is unlikely this was a surgical abdomen. He also has not had any alcohol the last 24 hours and was offered some Ativan which she declines at this point.   Assessment:  Depression Alcohol abuse     Plan: * Psychiatric  consultation           Daymon Larsen, MD 07/14/15 [phone]

## 2015-07-14 NOTE — ED Notes (Signed)
Pt. transfered to BHU without incident after report from. Placed in room and oriented to unit. Pt. informed that for their safety all care areas are designed for safety and monitored by security cameras at all times; and visiting hours explained to patient. Patient verbalizes understanding, and verbal contract for safety obtained.   

## 2015-07-14 NOTE — ED Notes (Signed)
Report received from [facility], pt moved to [facility] per md order.

## 2015-07-14 NOTE — ED Notes (Signed)
Pt calm and cooperative at this time, no complaints.  Will continue to monitor, awaiting to talk to psychiatrist..

## 2015-07-14 NOTE — ED Notes (Signed)
Pt to ed with c/o depression and alcohol abuse over the past few days. Pt crying at triage.  Pt reports suicidal thoughts at triage.  States " I have got to find a way out of my situation"

## 2015-07-14 NOTE — ED Notes (Signed)

## 2015-07-14 NOTE — ED Notes (Signed)
Patient resting comfortably in room. No complaints or concerns voiced. No distress or abnormal behavior noted. Will continue to monitor with security cameras. Q 15 minute rounds continue.

## 2015-07-14 NOTE — ED Notes (Signed)
ED BHU PLACEMENT JUSTIFICATION Is the patient under IVC or is there intent for IVC: No. Is the patient medically cleared: Yes.   Is there vacancy in the ED BHU: Yes.   Is the population mix appropriate for patient: Yes.   Is the patient awaiting placement in inpatient or outpatient setting: Yes.   Has the patient had a psychiatric consult: Yes.   Survey of unit performed for contraband, proper placement and condition of furniture, tampering with fixtures in bathroom, shower, and each patient room: Yes.  ; Findings:  APPEARANCE/BEHAVIOR calm, cooperative and adequate rapport can be established NEURO ASSESSMENT Orientation: time, place and person Hallucinations: No.None noted (Hallucinations) Speech: Normal Gait: normal RESPIRATORY ASSESSMENT Normal expansion.  Clear to auscultation.  No rales, rhonchi, or wheezing. CARDIOVASCULAR ASSESSMENT regular rate and rhythm, S1, S2 normal, no murmur, click, rub or gallop GASTROINTESTINAL ASSESSMENT soft, nontender, BS WNL, no r/g EXTREMITIES normal strength, tone, and muscle mass PLAN OF CARE Provide calm/safe environment. Vital signs assessed twice daily. ED BHU Assessment once each 12-hour shift. Collaborate with intake RN daily or as condition indicates. Assure the ED provider has rounded once each shift. Provide and encourage hygiene. Provide redirection as needed. Assess for escalating behavior; address immediately and inform ED provider.  Assess family dynamic and appropriateness for visitation as needed: Yes.  ; If necessary, describe findings:  Educate the patient/family about BHU procedures/visitation: Yes.  ; If necessary, describe findings:  

## 2015-07-14 NOTE — BH Assessment (Signed)
Assessment Note  Donald Austin is an 54 y.o. male. Pt states that he cheated on his wife of 56yrs. Since then he has been kicked out of the home and his children are upset with him. Pt expressed distress over current living environment (with current). Pt reported feelings of sadness, hopelessness and extreme guilt. Pt reported several additional stressors including currently being sued. Pt provided elusive responses to SI inquiries and made statements such as "I wish this was a dream", "I just want to end it", "I want to run away or something". Pt stated "I can revert back to sin". Upon seeking clarification, Pt explained that he is having urges to drink excessively and stated "its too painful to stay sober". Pt reports no hx of SI or access to weapons. Pt reports no HI. When asked about hallucinations, Pt responded " Sometimes but, I don't think I am", Pt also reported seeing "things" in his peripheral vision at times. Pt reports poor appetite and difficulty concentrating, remembering and making decisions. Pt reports daily alcohol consumption (3 24oz beers, last use Saturday 07/12/15). Pt states that he is a "social smoker" (weed) 2 or 3 "puffs" 1x/wk. Pt reports no additional drug use and no SA tx hx.   Pt referred to Psych MD for consult.   Axis I: Depression  Past Medical History:  Past Medical History  Diagnosis Date  . GERD (gastroesophageal reflux disease)   . Anemia     NOS  . PVD (peripheral vascular disease)     Past Surgical History  Procedure Laterality Date  . Fatty tissue removed from neck  2004  . Colonoscopy  2012    Family History:  Family History  Problem Relation Age of Onset  . Hypertension Father   . Kidney failure Father   . Hypertension Mother   . Cancer Sister     breast  . Heart attack Neg Hx     <55  . Hypertension Sister   . Hypertension Sister   . Hypertension Brother   . Hypertension Brother   . Cancer Mother     Social History:  reports that  he has been smoking Cigarettes.  He has been smoking about 0.50 packs per day. He does not have any smokeless tobacco history on file. He reports that he drinks about 3.6 oz of alcohol per week. He reports that he uses illicit drugs (Marijuana).  Additional Social History:  Alcohol / Drug Use Pain Medications: None Reported Prescriptions: None Reported Over the Counter: None Reported History of alcohol / drug use?: Yes Longest period of sobriety (when/how long): Not Reported Negative Consequences of Use:  (None Reported) Withdrawal Symptoms:  (None Reported) Substance #1 Name of Substance 1: Cannabis 1 - Age of First Use: Not Reported 1 - Amount (size/oz): "2 or 3 puffs" 1 - Frequency: 1x/wk 1 - Duration: Not Reported 1 - Last Use / Amount: "Saturday" (07/12/15) Substance #2 Name of Substance 2: Alcohol 2 - Age of First Use: Not reported 2 - Amount (size/oz): 3 24ox beers 2 - Frequency: daily 2 - Duration: "a long time", Pt reported  >72yr with prompting 2 - Last Use / Amount: "Saturday" (07/12/15)/ 3 24ox beers  CIWA: CIWA-Ar BP: (!) 141/98 mmHg Pulse Rate: 88 COWS:    Allergies: No Known Allergies  Home Medications:  (Not in a hospital admission)  OB/GYN Status:  No LMP for male patient.  General Assessment Data Location of Assessment: [facility] ED TTS Assessment: In system Is this  a Tele or Face-to-Face Assessment?: Face-to-Face Is this an Initial Assessment or a Re-assessment for this encounter?: Initial Assessment Marital status: Married [place] name: N/A Is patient pregnant?: No Pregnancy Status: No Living Arrangements: Other relatives (With cousing while unable to live with wife) Can pt return to current living arrangement?: Yes Admission Status: Voluntary Is patient capable of signing voluntary admission?: Yes Referral Source: Self/Family/Friend Insurance type: Materials engineer Exam ([place]) Medical Exam completed: Yes  Crisis Care Plan Living  Arrangements: Other relatives (With cousing while unable to live with wife) Name of Psychiatrist: None Name of Therapist: None  Education Status Is patient currently in school?: No Current Grade: N/A Highest grade of school patient has completed: 12th Name of school: N/A Contact person: None Reported  Risk to self with the past 6 months Suicidal Ideation:  (Unknown) Has patient been a risk to self within the past 6 months prior to admission? : No Suicidal Intent:  (Unknown) Has patient had any suicidal intent within the past 6 months prior to admission? : No Is patient at risk for suicide?:  (y) Suicidal Plan?: No Has patient had any suicidal plan within the past 6 months prior to admission? : No Access to Means: No What has been your use of drugs/alcohol within the last 12 months?: daily alcohol use, cannabis 1x/wk Previous Attempts/Gestures: No How many times?: 0 Other Self Harm Risks: guilt, Urge to drink excessively Intentional Self Injurious Behavior: None Family Suicide History: No Recent stressful life event(s): Conflict (Comment), Financial Problems, Other (Comment) (conflict within marriage & w/children, currently being sued) Persecutory voices/beliefs?: No Depression: Yes Depression Symptoms: Despondent Substance abuse history and/or treatment for substance abuse?: Yes Suicide prevention information given to non-admitted patients: Not applicable  Risk to Others within the past 6 months Homicidal Ideation: No Does patient have any lifetime risk of violence toward others beyond the six months prior to admission? : No Thoughts of Harm to Others: No Current Homicidal Intent: No Current Homicidal Plan: No Access to Homicidal Means: No Identified Victim: N/A History of harm to others?: No Assessment of Violence: None Noted Violent Behavior Description: N/A Does patient have access to weapons?: No Criminal Charges Pending?: No Does patient have a court date: No Is  patient on probation?: No  Psychosis Hallucinations: Visual ("Sometimes but, I don't think I am") Delusions: None noted  Mental Status Report Appearance/Hygiene: In scrubs Eye Contact: Fair Motor Activity: Unremarkable Speech: Soft Level of Consciousness: Alert Mood: Depressed, Anxious, Sad, Helpless, Guilty Affect: Anxious, Depressed, Sad Anxiety Level: Moderate Thought Processes: Coherent, Relevant Judgement: Unimpaired Orientation: Person, Place, Time, Situation, Appropriate for developmental age Obsessive Compulsive Thoughts/Behaviors: None  Cognitive Functioning Concentration: Fair Memory: Recent Intact, Remote Intact IQ: Average Insight: Fair Impulse Control: Fair Appetite: Poor Weight Loss:  ("I don't know" ) Weight Gain:  ("I don't know") Sleep: Decreased Total Hours of Sleep: 1 Vegetative Symptoms: None  ADLScreening [facility] Assessment Services) Patient's cognitive ability adequate to safely complete daily activities?: Yes Patient able to express need for assistance with ADLs?: Yes Independently performs ADLs?: Yes (appropriate for developmental age)  Prior Inpatient Therapy Prior Inpatient Therapy: No Prior Therapy Dates: N/A Prior Therapy Facilty/Provider(s): N/A Reason for Treatment: N/A  Prior Outpatient Therapy Prior Outpatient Therapy: No Prior Therapy Dates: N/A Prior Therapy Facilty/Provider(s): N/A Reason for Treatment: N/A Does patient have an ACCT team?: No Does patient have Intensive In-House Services?  : No Does patient have [facility] services? : No Does patient have P4CC services?:  No  ADL Screening (condition at time of admission) Patient's cognitive ability adequate to safely complete daily activities?: Yes Is the patient deaf or have difficulty hearing?: No Does the patient have difficulty seeing, even when wearing glasses/contacts?:  (Pt reports needing glasses) Does the patient have difficulty concentrating, remembering, or making  decisions?: Yes Patient able to express need for assistance with ADLs?: Yes Does the patient have difficulty dressing or bathing?: No Independently performs ADLs?: Yes (appropriate for developmental age) Does the patient have difficulty walking or climbing stairs?: No Weakness of Legs: Both (Pt reports weakness and cramping) Weakness of Arms/Hands: Both (Pt reports weakness and cramping)  Home Assistive Devices/Equipment Home Assistive Devices/Equipment: None  Therapy Consults (therapy consults require a physician order) PT Evaluation Needed: No OT Evalulation Needed: No SLP Evaluation Needed: No Abuse/Neglect Assessment (Assessment to be complete while patient is alone) Physical Abuse: Denies Verbal Abuse: Denies Sexual Abuse: Denies Exploitation of patient/patient's resources: Denies Self-Neglect: Denies Values / Beliefs Cultural Requests During Hospitalization: None Spiritual Requests During Hospitalization: None Consults Spiritual Care Consult Needed: No Social Work Consult Needed: No Regulatory affairs officer (For Healthcare) Does patient have an advance directive?: No Would patient like information on creating an advanced directive?: No - patient declined information    Additional Information 1:1 In Past 12 Months?: No CIRT Risk: No Elopement Risk: No Does patient have medical clearance?: No     Disposition:  Disposition Initial Assessment Completed for this Encounter: Yes Disposition of Patient: Referred to (Psych MD consult)  On Site Evaluation by:   Reviewed with Physician:    Fatima J Martinique 07/14/2015 6:43 PM

## 2015-07-14 NOTE — ED Notes (Addendum)

## 2015-07-14 NOTE — ED Notes (Signed)

## 2015-07-14 NOTE — ED Notes (Addendum)

## 2015-07-15 ENCOUNTER — Inpatient Hospital Stay
Admission: EM | Admit: 2015-07-15 | Discharge: 2015-07-18 | DRG: 881 | Disposition: A | Discharge status: Home / Self Care | Payer: Managed Care, Other (non HMO) | Source: Intra-hospital | Attending: Psychiatry | Admitting: Psychiatry

## 2015-07-15 DIAGNOSIS — G4700 Insomnia, unspecified: Secondary | ICD-10-CM | POA: Diagnosis present

## 2015-07-15 DIAGNOSIS — R45851 Suicidal ideations: Secondary | ICD-10-CM | POA: Diagnosis present

## 2015-07-15 DIAGNOSIS — F1020 Alcohol dependence, uncomplicated: Secondary | ICD-10-CM | POA: Diagnosis present

## 2015-07-15 DIAGNOSIS — F410 Panic disorder [episodic paroxysmal anxiety] without agoraphobia: Secondary | ICD-10-CM | POA: Diagnosis present

## 2015-07-15 DIAGNOSIS — F4321 Adjustment disorder with depressed mood: Secondary | ICD-10-CM | POA: Diagnosis present

## 2015-07-15 DIAGNOSIS — Z803 Family history of malignant neoplasm of breast: Secondary | ICD-10-CM

## 2015-07-15 DIAGNOSIS — I739 Peripheral vascular disease, unspecified: Secondary | ICD-10-CM | POA: Diagnosis present

## 2015-07-15 DIAGNOSIS — Z809 Family history of malignant neoplasm, unspecified: Secondary | ICD-10-CM

## 2015-07-15 DIAGNOSIS — Z9889 Other specified postprocedural states: Secondary | ICD-10-CM

## 2015-07-15 DIAGNOSIS — K219 Gastro-esophageal reflux disease without esophagitis: Secondary | ICD-10-CM | POA: Diagnosis present

## 2015-07-15 DIAGNOSIS — F17210 Nicotine dependence, cigarettes, uncomplicated: Secondary | ICD-10-CM | POA: Diagnosis present

## 2015-07-15 DIAGNOSIS — Z8249 Family history of ischemic heart disease and other diseases of the circulatory system: Secondary | ICD-10-CM

## 2015-07-15 DIAGNOSIS — F329 Major depressive disorder, single episode, unspecified: Secondary | ICD-10-CM

## 2015-07-15 DIAGNOSIS — F17200 Nicotine dependence, unspecified, uncomplicated: Secondary | ICD-10-CM | POA: Diagnosis present

## 2015-07-15 MED ORDER — PNEUMOCOCCAL VAC POLYVALENT 25 MCG/0.5ML IJ INJ
0.5000 mL | INJECTION | INTRAMUSCULAR | Status: AC
  Administered 2015-07-16: 0.5 mL via INTRAMUSCULAR
  Filled 2015-07-15: qty 0.5

## 2015-07-15 MED ORDER — DIPHENHYDRAMINE HCL 25 MG PO CAPS
50.0000 mg | ORAL_CAPSULE | Freq: Every evening | ORAL | Status: DC | PRN
  Administered 2015-07-15 – 2015-07-17 (×3): 50 mg via ORAL
  Filled 2015-07-15 (×3): qty 2

## 2015-07-15 MED ORDER — INFLUENZA VAC SPLIT QUAD 0.5 ML IM SUSY
0.5000 mL | PREFILLED_SYRINGE | INTRAMUSCULAR | Status: AC
  Administered 2015-07-16: 0.5 mL via INTRAMUSCULAR
  Filled 2015-07-15: qty 0.5

## 2015-07-15 MED ORDER — ALUM & MAG HYDROXIDE-SIMETH 200-200-20 MG/5ML PO SUSP: 30.0000 mL | ORAL | Status: DC | PRN

## 2015-07-15 MED ORDER — MAGNESIUM HYDROXIDE 400 MG/5ML PO SUSP: 30.0000 mL | Freq: Every day | ORAL | Status: DC | PRN

## 2015-07-15 MED ORDER — NICOTINE 10 MG IN INHA: 1.0000 | RESPIRATORY_TRACT | Status: DC | PRN

## 2015-07-15 MED ORDER — ACETAMINOPHEN 325 MG PO TABS: 650.0000 mg | ORAL_TABLET | Freq: Four times a day (QID) | ORAL | Status: DC | PRN

## 2015-07-15 NOTE — ED Notes (Signed)
ENVIRONMENTAL ASSESSMENT Potentially harmful objects out of patient reach: Yes.   Personal belongings secured: Yes.   Patient dressed in hospital provided attire only: Yes.   Plastic bags out of patient reach: Yes.   Patient care equipment (cords, cables, call bells, lines, and drains) shortened, removed, or accounted for: Yes.   Equipment and supplies removed from bottom of stretcher: Yes.   Potentially toxic materials out of patient reach: Yes.   Sharps container removed or out of patient reach: Yes.     Meal given.

## 2015-07-15 NOTE — ED Notes (Signed)

## 2015-07-15 NOTE — ED Notes (Signed)
Patient resting comfortably in room. No complaints or concerns voiced. No distress or abnormal behavior noted. Will continue to monitor with security cameras. Q 15 minute rounds continue.

## 2015-07-15 NOTE — ED Notes (Signed)
.  Patient resting comfortably in room. No complaints or concerns voiced. No distress or abnormal behavior noted. Will continue to monitor with security cameras. Q 15 minute rounds continue.

## 2015-07-15 NOTE — ED Notes (Signed)
Meal given to patient 

## 2015-07-15 NOTE — ED Notes (Signed)
Patient aware of pending admission to inpatient unit. RN answered most questions regarding admission and reassured patient. Patient currently in no acute distress. Maintained on 15 minute checks and observation by security camera for safety.

## 2015-07-15 NOTE — ED Notes (Signed)
Patient visited with daughter, supervised by patient advocate.

## 2015-07-15 NOTE — ED Notes (Signed)
Patient placed telephone call to wife.  Patient currently denies SI. He is being maintained on 15 minute checks and observation by security camera for safety.

## 2015-07-15 NOTE — Consult Note (Signed)
[facility] Face-to-Face Psychiatry Consult   Reason for Consult:  Consult for this 54 year old man with a history of alcohol abuse and recent depression. Referring Physician:  Marcelene Butte Patient Identification: Donald Austin MRN:  [id number] Principal Diagnosis: Adjustment disorder with depressed mood Diagnosis:   Patient Active Problem List   Diagnosis Date Noted  . Alcohol abuse [F10.10] 07/15/2015  . Major depression [F32.2] 07/15/2015  . Adjustment disorder with depressed mood [F43.21] 07/15/2015  . VITAMIN D DEFICIENCY [E55.9] 12/25/2010  . ANEMIA-NOS [D64.9] 12/25/2010  . TOBACCO USE [Z72.0] 12/25/2010  . 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 ABUSE, COCAINE, UNSPECIFIED [F14.10] 06/09/2007  . Peripheral vascular disease [I73.9] 06/09/2007  . GERD [K21.9] 06/09/2007    Total Time spent with patient: 1 hour  Subjective:   Donald Austin is a 54 y.o. male patient admitted with "I just don't know what I'm going to do".  HPI:  History from the patient and the chart. This 54 year old man came to the emergency room yesterday very distraught and overwhelmed at the time. He says that he is feeling very depressed and hopeless. Doesn't know how to take care of himself doesn't know what to do. His wife threw him out of the house this weekend because she discovered that he had been text Ng with another woman. He denies that there've been any sexual content to the relationship but his wife nevertheless threw him out. Patient eventually reveals that he's also been drinking heavily. Multiple 24 ounce beers a day. Sounds his wife might of been getting fed up for a while before that. Patient admits that his mood had been depressed even before this weekend. Since this weekend he's been staying at his cousin's house sleeping on the floor. He feels overwhelmed like he doesn't know which way to turn. Feels like he has no capacity to take care of himself. Patient denies any  current suicidal intent. When he came to the emergency room last night he was making some dramatic comments about wanting the pain to go away. Today although he denies suicidal intent he is still unable to come up with a coherent reasonable plan for self-care. He is not currently getting any psychiatric treatment or on any medicine.  Past psychiatric history: Denies past history of suicide attempt. Denies hospitalizations. He participated in the intensive outpatient program several years ago for his alcohol abuse.  Social history: Works at the [employer]. Has been married for 27 years. Has 3 children. Apparently his wife put him out of the house this weekend and the patient has been unable to even conceivable way to deal with the problem. He says that he has no other social assistance or backup plan.  Medical history: History of peripheral vascular disease some high blood pressure and a history of anemia and vitamin D deficiency.  Substance abuse history: No history of delirium tremens or seizures. Has had problems with cocaine abuse and marijuana and the abuse in the past which she says he's not doing currently.  Family history: Denies family history of mental health problems. HPI Elements:   Quality:  Depression and feeling hopeless and overwhelmed. Severity:  Moderate to severe with concern about suicidality. Timing:  Worse just in the last 3-4 days. Duration:  Probably has been going on for a couple weeks. Context:  Alcohol abuse and now getting thrown out of the house by his wife.  Past Medical History:  Past Medical History  Diagnosis Date  . GERD (gastroesophageal  reflux disease)   . Anemia     NOS  . PVD (peripheral vascular disease)     Past Surgical History  Procedure Laterality Date  . Fatty tissue removed from neck  2004  . Colonoscopy  2012   Family History:  Family History  Problem Relation Age of Onset  . Hypertension Father   . Kidney failure Father   .  Hypertension Mother   . Cancer Sister     breast  . Heart attack Neg Hx     <55  . Hypertension Sister   . Hypertension Sister   . Hypertension Brother   . Hypertension Brother   . Cancer Mother    Social History:  History  Alcohol Use  . 3.6 oz/week  . 6 Standard drinks or equivalent per week    Comment: occasionally (3-4 12oz./weekends)     History  Drug Use  . Yes  . Special: Marijuana    Comment: no inj. drugs    Social History   Social History  . Marital Status: Married    Spouse Name: N/A  . Number of Children: 3  . Years of Education: N/A   Occupational History  . Tour manager   Social History Main Topics  . Smoking status: Current Every Day Smoker -- 0.50 packs/day    Types: Cigarettes  . Smokeless tobacco: None     Comment: quit 4 days ago, 20pyh  . Alcohol Use: 3.6 oz/week    6 Standard drinks or equivalent per week     Comment: occasionally (3-4 12oz./weekends)  . Drug Use: Yes    Special: Marijuana     Comment: no inj. drugs  . Sexual Activity: Not Asked   Other Topics Concern  . None   Social History Narrative   Regular exercise--no   Children 3 healthy   Diet: Avoids fried food, baked, occ. Fruits and veggies   Additional Social History:    Pain Medications: None Reported Prescriptions: None Reported Over the Counter: None Reported History of alcohol / drug use?: Yes Longest period of sobriety (when/how long): Not Reported Negative Consequences of Use:  (None Reported) Withdrawal Symptoms:  (None Reported) Name of Substance 1: Cannabis 1 - Age of First Use: Not Reported 1 - Amount (size/oz): "2 or 3 puffs" 1 - Frequency: 1x/wk 1 - Duration: Not Reported 1 - Last Use / Amount: "Saturday" (07/12/15) Name of Substance 2: Alcohol 2 - Age of First Use: Not reported 2 - Amount (size/oz): 3 24ox beers 2 - Frequency: daily 2 - Duration: "a long time", Pt reported  >37yrwith prompting 2 - Last Use / Amount: "Saturday"  (07/12/15)/ 3 24ox beers                 Allergies:  No Known Allergies  Labs:  Results for orders placed or performed during the hospital encounter of 07/14/15 (from the past 48 hour(s))  Comprehensive metabolic panel     Status: Abnormal   Collection Time: 07/14/15  2:45 PM  Result Value Ref Range   Sodium 138 135 - 145 mmol/L   Potassium 4.0 3.5 - 5.1 mmol/L   Chloride 103 101 - 111 mmol/L   CO2 26 22 - 32 mmol/L   Glucose, Bld 107 (H) 65 - 99 mg/dL   BUN 12 6 - 20 mg/dL   Creatinine, Ser 1.05 0.61 - 1.24 mg/dL   Calcium 9.6 8.9 - 10.3 mg/dL   Total Protein 8.1  6.5 - 8.1 g/dL   Albumin 4.5 3.5 - 5.0 g/dL   AST 31 15 - 41 U/L   ALT 29 17 - 63 U/L   Alkaline Phosphatase 84 38 - 126 U/L   Total Bilirubin 1.1 0.3 - 1.2 mg/dL   GFR calc non Af Amer >60 >60 mL/min   GFR calc Af Amer >60 >60 mL/min    Comment: (NOTE) The eGFR has been calculated using the CKD EPI equation. This calculation has not been validated in all clinical situations. eGFR's persistently <60 mL/min signify possible Chronic Kidney Disease.    Anion gap 9 5 - 15  Ethanol (ETOH)     Status: None   Collection Time: 07/14/15  2:45 PM  Result Value Ref Range   Alcohol, Ethyl (B) <5 <5 mg/dL    Comment:        LOWEST DETECTABLE LIMIT FOR SERUM ALCOHOL IS 5 mg/dL FOR MEDICAL PURPOSES ONLY   CBC     Status: None   Collection Time: 07/14/15  2:45 PM  Result Value Ref Range   WBC 8.0 3.8 - 10.6 K/uL   RBC 5.39 4.40 - 5.90 MIL/uL   Hemoglobin 16.1 13.0 - 18.0 g/dL   HCT 47.3 40.0 - 52.0 %   MCV 87.7 80.0 - 100.0 fL   MCH 29.8 26.0 - 34.0 pg   MCHC 34.0 32.0 - 36.0 g/dL   RDW 12.9 11.5 - 14.5 %   Platelets 225 150 - 440 K/uL  Urine Drug Screen, Qualitative ([facility] only)     Status: None   Collection Time: 07/14/15  2:46 PM  Result Value Ref Range   Tricyclic, Ur Screen NONE DETECTED NONE DETECTED   Amphetamines, Ur Screen NONE DETECTED NONE DETECTED   MDMA (Ecstasy)Ur Screen NONE DETECTED NONE  DETECTED   Cocaine Metabolite,Ur [place] NONE DETECTED NONE DETECTED   Opiate, Ur Screen NONE DETECTED NONE DETECTED   Phencyclidine (PCP) Ur S NONE DETECTED NONE DETECTED   Cannabinoid 50 Ng, Ur [place] NONE DETECTED NONE DETECTED   Barbiturates, Ur Screen NONE DETECTED NONE DETECTED   Benzodiazepine, Ur Scrn NONE DETECTED NONE DETECTED   Methadone Scn, Ur NONE DETECTED NONE DETECTED    Comment: (NOTE) 403  Tricyclics, urine               Cutoff 1000 ng/mL 200  Amphetamines, urine             Cutoff 1000 ng/mL 300  MDMA (Ecstasy), urine           Cutoff 500 ng/mL 400  Cocaine Metabolite, urine       Cutoff 300 ng/mL 500  Opiate, urine                   Cutoff 300 ng/mL 600  Phencyclidine (PCP), urine      Cutoff 25 ng/mL 700  Cannabinoid, urine              Cutoff 50 ng/mL 800  Barbiturates, urine             Cutoff 200 ng/mL 900  Benzodiazepine, urine           Cutoff 200 ng/mL 1000 Methadone, urine                Cutoff 300 ng/mL 1100 1200 The urine drug screen provides only a preliminary, unconfirmed 1300 analytical test result and should not be used for non-medical 1400 purposes. Clinical consideration and professional judgment should 1500 be  applied to any positive drug screen result due to possible 1600 interfering substances. A more specific alternate chemical method 1700 must be used in order to obtain a confirmed analytical result.  1800 Gas chromato graphy / mass spectrometry (GC/MS) is the preferred 1900 confirmatory method.     Vitals: Blood pressure 123/78, pulse 65, temperature 97.6 F (36.4 C), temperature source Oral, resp. rate 18, height 6' (1.829 m), weight 79.379 kg (175 lb), SpO2 100 %.  Risk to Self: Suicidal Ideation:  (Unknown) Suicidal Intent:  (Unknown) Is patient at risk for suicide?:  (y) Suicidal Plan?: No Access to Means: No What has been your use of drugs/alcohol within the last 12 months?: daily alcohol use, cannabis 1x/wk How many times?: 0 Other Self  Harm Risks: guilt, Urge to drink excessively Intentional Self Injurious Behavior: None Risk to Others: Homicidal Ideation: No Thoughts of Harm to Others: No Current Homicidal Intent: No Current Homicidal Plan: No Access to Homicidal Means: No Identified Victim: N/A History of harm to others?: No Assessment of Violence: None Noted Violent Behavior Description: N/A Does patient have access to weapons?: No Criminal Charges Pending?: No Does patient have a court date: No Prior Inpatient Therapy: Prior Inpatient Therapy: No Prior Therapy Dates: N/A Prior Therapy Facilty/Provider(s): N/A Reason for Treatment: N/A Prior Outpatient Therapy: Prior Outpatient Therapy: No Prior Therapy Dates: N/A Prior Therapy Facilty/Provider(s): N/A Reason for Treatment: N/A Does patient have an ACCT team?: No Does patient have Intensive In-House Services?  : No Does patient have [facility] services? : No Does patient have P4CC services?: No  Current Facility-Administered Medications  Medication Dose Route Frequency Provider Last Rate Last Dose  . nicotine (NICOTROL) 10 MG inhaler 1 continuous puffing  1 continuous puffing Inhalation PRN Daymon Larsen, MD   1 continuous puffing at 07/14/15 1719   No current outpatient prescriptions on file.    Musculoskeletal: Strength & Muscle Tone: within normal limits Gait & Station: normal Patient leans: N/A  Psychiatric Specialty Exam: Physical Exam  Nursing note and vitals reviewed. Constitutional: He appears well-developed and well-nourished.  HENT:  Head: Normocephalic and atraumatic.  Eyes: Conjunctivae are normal. Pupils are equal, round, and reactive to light.  Neck: Normal range of motion.  Cardiovascular: Normal heart sounds.   Respiratory: Effort normal.  GI: Soft.  Musculoskeletal: Normal range of motion.  Neurological: He is alert.  Skin: Skin is warm and dry.  Psychiatric: His mood appears anxious. His speech is delayed. He is slowed.  Cognition and memory are impaired. He expresses impulsivity. He exhibits a depressed mood. He exhibits abnormal remote memory.    Review of Systems  Constitutional: Negative.   HENT: Negative.   Eyes: Negative.   Respiratory: Negative.   Cardiovascular: Negative.   Gastrointestinal: Negative.   Musculoskeletal: Negative.   Skin: Negative.   Neurological: Negative.   Psychiatric/Behavioral: Positive for depression and substance abuse. Negative for suicidal ideas and hallucinations. The patient is nervous/anxious and has insomnia.     Blood pressure 123/78, pulse 65, temperature 97.6 F (36.4 C), temperature source Oral, resp. rate 18, height 6' (1.829 m), weight 79.379 kg (175 lb), SpO2 100 %.Body mass index is 23.73 kg/(m^2).  General Appearance: Disheveled  Eye Contact::  Minimal  Speech:  Pressured  Volume:  Decreased  Mood:  Anxious, Depressed and Dysphoric  Affect:  Depressed  Thought Process:  Tangential  Orientation:  Full (Time, Place, and Person)  Thought Content:  Negative  Suicidal Thoughts:  Patient denies suicidal ideation but  has multiple suicide risk factors and continues to feel very hopeless  Homicidal Thoughts:  No  Memory:  Immediate;   Fair Recent;   Fair Remote;   Fair  Judgement:  Impaired  Insight:  Shallow  Psychomotor Activity:  Psychomotor Retardation  Concentration:  Fair  Recall:  [employer] of Knowledge:Fair  Language: Fair  Akathisia:  No  Handed:  Right  AIMS (if indicated):     Assets:  Communication Skills Desire for Improvement Financial Resources/Insurance Resilience  ADL's:  Intact  Cognition: WNL  Sleep:      Medical Decision Making: New problem, with additional work up planned, Review of Psycho-Social Stressors (1), Review or order clinical lab tests (1), Review and summation of old records (2) and Review or order medicine tests (1)  Treatment Plan Summary: Daily contact with patient to assess and evaluate symptoms and progress  in treatment, Medication management and Plan Despite the fact that he is denying suicidal ideation I think this patient has multiple risk factors for suicide. He has just been thrown out of the house by his wife. He's been drinking. He feels like he has no support. He feels hopeless and overwhelmed. I had hoped that these things would improve by today but he is still expressing all of them. I made some attempt to get him to think through things more clearly and he remains very hopeless and disorganized. He did not have a positive alcohol on presentation but has been drinking recently. I think he is safer to be admitted to the hospital because I think he is high risk for suicide or other dangerous behaviors outside the hospital. I have filed involuntary commitment paperwork and we'll have him admitted downstairs with a diagnosis for now adjustment disorder. Patient informed of the plan.  Plan:  Recommend psychiatric Inpatient admission when medically cleared. Disposition: Admit to psychiatric ward  Alethia Berthold 07/15/2015 5:17 PM

## 2015-07-15 NOTE — ED Notes (Signed)
Patient resting in bed quietly. In no apparent distress. Will maintain on all safety precautions.

## 2015-07-15 NOTE — ED Notes (Addendum)
Patient seen by MD. Awaiting disposition. Maintained on 15 minute checks and observation by security camera for safety.

## 2015-07-15 NOTE — ED Notes (Signed)
Patient has spoken with family members on the telephone. Patient wants his wife to accept him back even though he "was cheating with another women." States he does not know what he will do without family. He does have a place to stay  - with a cousin. Patient says he is interested in outpatient treatment. Maintained on 15 minute checks and observation by security camera for safety.

## 2015-07-15 NOTE — ED Provider Notes (Signed)
-----------------------------------------   5:58 PM on 07/15/2015 -----------------------------------------  Dr. Weber Cooks put an IVC paperwork.  I put in the initiate petition for commitment order.  ----------------------------------------- 7:15 PM on 07/15/2015 -----------------------------------------   BP 123/78 mmHg  Pulse 65  Temp(Src) 97.6 F (36.4 C) (Oral)  Resp 18  Ht 6' (1.829 m)  Wt 175 lb (79.379 kg)  BMI 23.73 kg/m2  SpO2 100%  I spoke in person with Dr. Weber Cooks who evaluated the patient and feels that the patient needs in-patient admission.   Hinda Kehr, MD 07/15/15 [phone]

## 2015-07-15 NOTE — ED Notes (Signed)
Patient resting in room. In no apparent distress. Waiting for psychiatrist.  Maintained on 15 minute checks and observation by security camera for safety.

## 2015-07-15 NOTE — ED Notes (Signed)
Patient called family. Sitting in dayroom. Offers no complaints.Maintained on 15 minute checks and observation by security camera for safety.

## 2015-07-15 NOTE — Tx Team (Signed)
Initial Interdisciplinary Treatment Plan   PATIENT STRESSORS: Financial difficulties Legal issue Marital or family conflict   PATIENT STRENGTHS: Capable of independent living Curator fund of knowledge   PROBLEM LIST: Problem List/Patient Goals Date to be addressed Date deferred Reason deferred Estimated date of resolution  Anxiety 07/15/2015     Depression 07/15/2015                                                DISCHARGE CRITERIA:  Adequate post-discharge living arrangements Improved stabilization in mood, thinking, and/or behavior  PRELIMINARY DISCHARGE PLAN: Return to previous living arrangement  PATIENT/FAMIILY INVOLVEMENT: This treatment plan has been presented to and reviewed with the patient, Donald Austin, and/or family member.  The patient and family have been given the opportunity to ask questions and make suggestions.  Pete Glatter 07/15/2015, 11:54 PM

## 2015-07-15 NOTE — ED Provider Notes (Signed)
-----------------------------------------   7:02 AM on 07/15/2015 -----------------------------------------   Blood pressure 115/74, pulse 59, temperature 98.6 F (37 C), temperature source Oral, resp. rate 16, height 6' (1.829 m), weight 175 lb (79.379 kg), SpO2 99 %.  The patient had no acute events since last update.  Calm and cooperative at this time.  Disposition is pending per Psychiatry/Behavioral Medicine team recommendations.   Patient awaiting psych evaluation  Loney Hering, MD 07/15/15 [phone]

## 2015-07-15 NOTE — ED Notes (Addendum)

## 2015-07-16 ENCOUNTER — Encounter: Payer: Self-pay | Admitting: Psychiatry

## 2015-07-16 DIAGNOSIS — F4321 Adjustment disorder with depressed mood: Principal | ICD-10-CM

## 2015-07-16 MED ORDER — NICOTINE 21 MG/24HR TD PT24
21.0000 mg | MEDICATED_PATCH | Freq: Every day | TRANSDERMAL | Status: DC
  Administered 2015-07-16 – 2015-07-17 (×2): 21 mg via TRANSDERMAL
  Filled 2015-07-16 (×2): qty 1

## 2015-07-16 NOTE — Progress Notes (Signed)
Recreation Therapy Notes  INPATIENT RECREATION THERAPY ASSESSMENT  Patient Details Name: CHANE COWDEN MRN: [id number] DOB: 08/05/1961 Today's Date: 07/16/2015  Patient Stressors: Family, Relationship, Work, Other (Comment) (Not a great relationship with family - doesn't spend time with his kids; recent separation with wife due to him cheating with text messages on her; finances)  Coping Skills:   Isolate, Arguments, Avoidance, Substance Abuse, Talking, Music, Sports  Personal Challenges: Anger, Communication, Concentration, Decision-Making, Expressing Yourself, Problem-Solving, Relationships, Self-Esteem/Confidence, Social Interaction, Stress Management, Substance Abuse, Time Management, Trusting Others, Work Midwife (2+):  Music - Listen, Individual - Other (Comment) (Sports)  Awareness of Community Resources:  Yes  Community Resources:  [place], [place]  Current Use: Yes  If no, Barriers?:    Patient Strengths:  Tolerate a lot, kind to others  Patient Identified Areas of Improvement:  Relationships, accepting help  Current Recreation Participation:  Drink beer and listen to music  Patient Goal for Hospitalization:  To be a better husband and father when he gets home  [place] of Residence:  [place] of Residence:  [place]   Current [place] (including self-harm):  No  Current HI:  No  Consent to Intern Participation: N/A   Leonette [facility], LRT/CTRS 07/16/2015, 5:04 PM

## 2015-07-16 NOTE — Progress Notes (Signed)
Writer has completed a pre-cert on pt. Writer spoke with [employer] [facility] from [place]. Pt is authorized for 3 days. 07/15/15-07/17/15 with a concurrent reviewed due on 07/17/15. Authorization # [id number].  Reviewer has provided a contact number to assess a provider list if assistance is needed with discharge planning.  [phone]   07/16/2015 Con Memos, MS, NCC, LPCA

## 2015-07-16 NOTE — Progress Notes (Signed)
Patient admitted to unit with depressed mood and anxiety after separation from his wife. The patient states that he drinks alcohol daily after work and even more on the weekends. The patient states that he does not know what he will do if things do not get better with his wife. The patient states that he feels worthless, helpless, and lonely. The patient currently denies SI/ HI. The patient was searched and no contraband found. Q 15 min checks maintained during shift for safety. Benadryl 50 mg given for sleep. Will continue to monitor for safety.

## 2015-07-16 NOTE — [facility] Group Notes (Signed)
[place] Group Notes:  (Nursing/MHT/Case Management/Adjunct)  Date:  07/16/2015  Time:  5:19 PM  Type of Therapy:  Psychoeducational Skills  Participation Level:  Active  Participation Quality:  Appropriate, Attentive and Sharing  Affect:  Appropriate  Cognitive:  Alert and Appropriate  Insight:  Appropriate and Good  Engagement in Group:  Engaged  Modes of Intervention:  Discussion, Education and Support  Summary of Progress/Problems:  Donald Austin 07/16/2015, 5:19 PM
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
## 2015-07-16 NOTE — Progress Notes (Signed)
Recreation Therapy Notes  Date: 09.21.16 Time: 3:00 pm Location: Craft Room  Group Topic: Self-esteem  Goal Area(s) Addresses:  Patient will write at least one positive trait. Patient will verbalize benefit of having a good self-esteem.  Behavioral Response: Attentive, Left early  Intervention: I Am  Activity: Patients were given a worksheet with the letter I on it and instructed to write as many positive traits about themselves inside the I.  Education: LRT educated patients on ways they can increase their self-esteem.   Education Outcome: Patient left group before LRT educated patients.  Clinical Observations/Feedback: Patient completed activity by filling approximately 30% of worksheet with positive traits. Patient left group at approximately 3:19 pm to speak with the Chaplin. Patient did not return to group.  Leonette Monarch, LRT/CTRS 07/16/2015 4:46 PM

## 2015-07-16 NOTE — Progress Notes (Signed)
D: Patient alert and oriented to person, place, time, and situation. Patient denies suicidal ideation, homicidal ideation, and hallucinations. Patient stated "I feel shameful and guilty" when talking about marital problems. Patient expressed feelings of hopelessness, but after his wife had told him he could come back home, he stated "I've got some hope now." Expressed strong willingness to participate in treatment. Rated depression score of 5 today, and affect blunted. Patient indicated prayer was a significant source of support.  A: Actively listened to patient express feelings about marital problems and current hospitalization. Reinforced patients desire to participate in treatment and continue processing emotions. Provided educational material on smoking cessation. Placed an order for chaplain consult.  R: Patient expressed desire to quit tobacco use and eagerly accepted tobacco cessation education. Participated in groups throughout the day, but left recreational group early to meet chaplain. Indicated discussion with chaplain was very helpful. Will continue Q 15 min checks.

## 2015-07-16 NOTE — [facility] Suicide Risk Assessment (Signed)
[facility] Admission Suicide Risk Assessment   Nursing information obtained from:    Demographic factors:    Current Mental Status:    Loss Factors:    Historical Factors:    Risk Reduction Factors:    Total Time spent with patient: 1 hour Principal Problem: <principal problem not specified> Diagnosis:   Patient Active Problem List   Diagnosis Date Noted  . Alcohol use disorder, moderate, dependence [F10.20] 07/15/2015  . Major depression [F32.2] 07/15/2015  . Adjustment disorder with depressed mood [F43.21] 07/15/2015  . VITAMIN D DEFICIENCY [E55.9] 12/25/2010  . ANEMIA-NOS [D64.9] 12/25/2010  . Tobacco use disorder [Z72.0] 12/25/2010  . 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 ABUSE, COCAINE, UNSPECIFIED [F14.10] 06/09/2007  . Peripheral vascular disease [I73.9] 06/09/2007  . GERD [K21.9] 06/09/2007     Continued Clinical Symptoms:  Alcohol Use Disorder Identification Test Final Score (AUDIT): 26 The "Alcohol Use Disorders Identification Test", Guidelines for Use in Primary Care, Second Edition.  World Pharmacologist [facility]). Score between 0-7:  no or low risk or alcohol related problems. Score between 8-15:  moderate risk of alcohol related problems. Score between 16-19:  high risk of alcohol related problems. Score 20 or above:  warrants further diagnostic evaluation for alcohol dependence and treatment.   CLINICAL FACTORS:   Severe Anxiety and/or Agitation Alcohol/Substance Abuse/Dependencies   Musculoskeletal: Strength & Muscle Tone: within normal limits Gait & Station: normal Patient leans: N/A  Psychiatric Specialty Exam: Physical Exam  Constitutional: He is oriented to person, place, and time. He appears well-developed and well-nourished.  HENT:  Head: Normocephalic and atraumatic.  Eyes: Conjunctivae and EOM are normal. Pupils are equal, round, and reactive to light.  Neck: Normal range of motion. Neck supple.  Cardiovascular: Normal  rate, regular rhythm and normal heart sounds.   Respiratory: Effort normal and breath sounds normal.  GI: Soft. Bowel sounds are normal.  Musculoskeletal: Normal range of motion.  Neurological: He is alert and oriented to person, place, and time. He has normal reflexes.  Skin: Skin is warm and dry.    Review of Systems  All other systems reviewed and are negative.   Blood pressure 122/70, pulse 78, temperature 98 F (36.7 C), temperature source Oral, resp. rate 20, height 6' (1.829 m), weight 79.379 kg (175 lb), SpO2 100 %.Body mass index is 23.73 kg/(m^2).  General Appearance: Casual  Eye Contact::  Fair  Speech:  Normal Rate  Volume:  Normal  Mood:  Anxious and Depressed  Affect:  Labile  Thought Process:  Goal Directed  Orientation:  Full (Time, Place, and Person)  Thought Content:  WDL  Suicidal Thoughts:  Yes.  with intent/plan  Homicidal Thoughts:  No  Memory:  Immediate;   Fair Recent;   Fair Remote;   Fair  Judgement:  Impaired  Insight:  Shallow  Psychomotor Activity:  Decreased  Concentration:  Fair  Recall:  [place]: Fair  Akathisia:  No  Handed:  Right  AIMS (if indicated):     Assets:  Communication Skills Desire for Improvement Physical Health  Sleep:  Number of Hours: 5  Cognition: WNL  ADL's:  Intact     COGNITIVE FEATURES THAT CONTRIBUTE TO RISK:  None    SUICIDE RISK:   Moderate:  Frequent suicidal ideation with limited intensity, and duration, some specificity in terms of plans, no associated intent, good self-control, limited dysphoria/symptomatology, some risk factors present, and identifiable protective factors, including available  and accessible social support.  PLAN OF CARE: Hospital admission, medication management, substance abuse counseling, discharge planning.  Medical Decision Making:  New problem, with additional work up planned, Review of Psycho-Social Stressors (1), Review or order clinical lab tests  (1), Review of Medication Regimen & Side Effects (2) and Review of New Medication or Change in Dosage (2)   Donald Austin is a 54 year old male with no past psychiatric history except alcoholism admitted for suicidal threats in the context of marital conflict.  1. Suicidal ideation. The patient still feels hopeless and is unable to contract for safety.   2. Mood. The patient is not ready to start any pharmacotherapy.  3. Smoking. Nicotine patch is available.   4. Alcohol abuse. Will monitor for symptoms of withdrawal.   5. Substance abuse treatment. The patient is not interested at this point   6. Disposition. He will be discharged to the homeless shelter. Follow-up with the local provider   I certify that inpatient services furnished can reasonably be expected to improve the patient's condition.   Jolanta Pucilowska 07/16/2015, 10:14 AM

## 2015-07-16 NOTE — Progress Notes (Signed)
   07/16/15 1600  Clinical Encounter Type  Visited With Patient  Visit Type Initial  Referral From Nurse  Consult/Referral To Chaplain  Spiritual Encounters  Spiritual Needs Emotional  Stress Factors  Patient Stress Factors Family relationships;Loss  Met w/patient to provide pastoral care & counseling. Concluded w/prayer. Need to follow up tomorrow.  [place]

## 2015-07-16 NOTE — Progress Notes (Signed)
INITIAL NUTRITION ASSESSMENT   INTERVENTION:  Meals and Snacks: encourage menu completion to best meet pt preferences Medical Food Supplement Therapy: will recommend on follow if intake poor    NUTRITION DIAGNOSIS:  No nutrition diagnosis at this time   GOAL:  Patient will meet greater than or equal to 90% of their needs  MONITOR:  Energy Intake, Anthropometrics   REASON FOR ASSESSMENT:  Malnutrition Screening Tool  ASSESSMENT:  Donald Austin is a 54 y.o. male with Adjustment disorder with depressed mood  Past Medical History  Diagnosis Date  . GERD (gastroesophageal reflux disease)   . Anemia     NOS  . PVD (peripheral vascular disease)     Diet Order: Regular  Current Nutrition: Pt eating 90-100% of meals since admission.   Food/Nutrition-Related History: Per MST pt with decreased appetite PTA. RD notes pt with h/o EtOH use and reports drinking after work and more on weekends in MD note.   Medications: reviewed  Protein Profile:  Protein Profile:  Recent Labs Lab 07/14/15 1445  ALBUMIN 4.5     Glucose and Electrolyte/Renal profile:   Recent Labs Lab 07/14/15 1445  NA 138  K 4.0  CL 103  CO2 26  BUN 12  CREATININE 1.05  CALCIUM 9.6  GLUCOSE 107*    Digestive System: WDL  Anthropometrics:   Body mass index is 23.73 kg/(m^2).  Filed Weights   07/16/15 0838  Weight: 175 lb (79.379 kg)   Weight Trend: Per [facility] pt with 3% weight loss in one month.  Wt Readings from Last 10 Encounters:  07/16/15 175 lb (79.379 kg)  07/14/15 175 lb (79.379 kg)  06/05/15 181 lb (82.101 kg)  12/25/10 180 lb 8 oz (81.874 kg)  12/14/06 179 lb 3.2 oz (81.285 kg)    LOW Care Level  Dwyane Luo, RD, LDN Pager [phone]

## 2015-07-16 NOTE — BHH Group Notes (Signed)
[place] LCSW Group Therapy  07/16/2015 4:24 PM  Type of Therapy:  Group Therapy  Participation Level:  Active  Participation Quality:  Appropriate  Affect:  Depressed  Cognitive:  Alert  Insight:  Improving  Engagement in Therapy:  Improving  Modes of Intervention:  Discussion, Education, Socialization and Support  Summary of Progress/Problems:Pt will identify unhealthy thoughts and how they impact their emotions and behavior. Pt will be encouraged to discuss these thoughts, emotions and behaviors with the group. Donald Austin attended group and stayed the entire time. He discussed mistakes he made in the past and how it impacts his thinking such as "It's never going to get better." When he starts thinking of his past mistakes he tends to start drinking alcohol. To combat these negative thoughts, he believes he needs to replace them with positive thoughts.   [place] MSW, [place]  07/16/2015, 4:24 PM

## 2015-07-16 NOTE — BHH Group Notes (Signed)
[facility] LCSW Aftercare Discharge Planning Group Note   07/16/2015 12:31 PM  Participation Quality:  Patient attended group and shared his SMART goal. Patient wants to discharge and is aware that compliance with treatment planning is important part of his stay and will help him work towards a discharge and find balance in his life.   Mood/Affect:  Blunted   Thoughts of Suicide:  No Will you contract for safety?   NA  Current AVH:  No  Plan for Discharge/Comments:  CSW will investigate  Transportation Means: CSW will investigate    Keene Breath, MSW, [employer]

## 2015-07-16 NOTE — Plan of Care (Signed)
Problem: Alteration in mood Goal: STG-Patient is able to discuss feelings and issues (Patient is able to discuss feelings and issues leading to depression)  Outcome: Progressing Patient was very open in discussing curent feelings and family issues.

## 2015-07-17 NOTE — Plan of Care (Signed)
Problem: Alteration in mood Goal: LTG-Pt's behavior demonstrates decreased signs of depression (Patient's behavior demonstrates decreased signs of depression to the point the patient is safe to return home and continue treatment in an outpatient setting)  Outcome: Not Progressing States "I cannot go without my family."

## 2015-07-17 NOTE — Progress Notes (Signed)
Recreation Therapy Notes  Date: 09.22.16 Time: 3:00 pm Location: Craft Room  Group Topic: Leisure Education  Goal Area(s) Addresses:  Patient will identify activities for each letter of the alphabet. Patient will verbalize ability to integrate positive leisure into life post d/c. Patient will verbalize ability to use leisure as a Technical sales engineer.  Behavioral Response: Attentive, Interactive  Intervention: Leisure Alphabet  Activity: Patients were given a leisure Air traffic controller and instructed to list healthy leisure activities for each letter of the alphabet.  Education: LRT educated patients on what they needed to participate in leisure activities.   Education Outcome: Acknowledges education/In group clarification offered  Clinical Observations/Feedback: Patient completed activity by filling approximately 50% of worksheet. Patient contributed to group discussion by stating healthy leisure activities, and what is needed to participate in leisure.  Leonette Monarch, LRT/CTRS 07/17/2015 4:51 PM

## 2015-07-17 NOTE — Plan of Care (Signed)
Problem: [facility] Participation in Recreation Therapeutic Interventions Goal: STG-Patient will demonstrate improved self esteem by identif STG: Self-Esteem - Within 3 treatment sessions, patient will verbalize at least 5 positive affirmation statements in each of 2 treatment sessions to increase self-esteem post d/c.  Outcome: Progressing Treatment Session 1; Completed 1 out of 2: At approximately 9:05 am, LRT met with patient in craft room. Patient verbalized 5 positive affirmation statements. Patient reported it felt "simple". LRT encouraged patient to continue saying positive affirmation statements.  Leonette Monarch, LRT/CTRS 09.22.16 5:27 pm Goal: STG-Patient will identify at least five coping skills for ** STG: Coping Skills - Within 3 treatment sessions, patient will verbalize at least 5 coping skills for substance abuse in each of 2 treatment sessions to decrease substance abuse post d/c.  Outcome: Progressing Treatment Session 1; Completed 1 out of 2: At approximately 9:05 am, LRT met with patient in craft room. Patient verbalized 5 coping skills for substance abuse. LRT educated patient on leisure and why it is important to implement it into his schedule. LRT provided patient with blank schedules to help him plan his day and try to avoid using substances. LRT educated patient on healthy support systems.  Leonette Monarch, LRT/CTRS 09.22.16 5:29 pm Goal: STG-Other Recreation Therapy Goal (Specify) STG: Stress Management - Within 3 treatment sessions, patient will verbalize understanding of the stress management techniques in each of 2 treatment sessions to increase stress management skills post d/c.  Outcome: Progressing Treatment Session 1; Completed 1 out of 2: At approximately 9:05 am, LRT met with patient in craft room. LRT educated and provided patient with handouts on the stress management techniques. Patient verbalized understanding. LRT encouraged patient to read over and practice  the stress management techniques.  Leonette Monarch, LRT/CTRS 09.22.16 5:32 pm

## 2015-07-17 NOTE — Progress Notes (Signed)
He is sad & depressed.States "I cannot go without my family." 'Visible in the milieu,attending groups.Minimal interaction with peers.He had meeting with wife and SW today.Denies suicidal ideation.

## 2015-07-17 NOTE — BHH Counselor (Signed)
Adult Comprehensive Assessment  Patient ID: Donald Austin, male   DOB: [birth year], 54 y.o.   MRN: [id number]  Information Source: Information source: Patient  Current Stressors:  Social relationships: cheated on wife Substance abuse: hx of alcohol use  Living/Environment/Situation:  Living Arrangements: Spouse/significant other  Family History:  Does patient have children?: Yes How many children?: 2 How is patient's relationship with their children?: good  Childhood History:     Education:  Learning disability?: No  Employment/Work Situation:   Employment situation: Employed  Pensions consultant:   Museum/gallery curator resources: Income from employment Does patient have a Programmer, applications or guardian?: No  Alcohol/Substance Abuse:      Social Support System:   Patient's Community Support System: Good  Leisure/Recreation:      Strengths/Needs:      Discharge Plan:   Does patient have access to transportation?: Yes Will patient be returning to same living situation after discharge?: Yes Currently receiving community mental health services: No If no, would patient like referral for services when discharged?: Yes (What county?) [facility]) Does patient have financial barriers related to discharge medications?: No  Summary/Recommendations:  Patient is a  Married 54 yo AA male admitted for SI and depression. Patient reports he was recently caught by his wife cheating when he accidentally was texting and sent his mistress a text to his family's phone number accidentally. Patient is regrets his actions and does not want to lose his family and feels hopeless. Patient plans to discharge home with his wife and will sleep in separate rooms till he can find more permanent arrangements and is encouraged by his wife and this Probation officer to continue his role as a father and work on himself. Patient's wife states patient may never have a relationship with her again but this may be due  to the fact that she is hurt at this point. Either way, patient is encouraged to focus on what he does have to work on which is himself and his role as a father. Patient is encouraged to participate in group therapy, medication managemtn and therapeutic milieu.     Keene Breath., MSW, Latanya Presser  07/17/2015

## 2015-07-17 NOTE — Progress Notes (Signed)
[facility] MD Progress Note  07/17/2015 2:35 PM Donald Austin  MRN:  [id number]  Subjective:  Donald Austin is still depressed and very anxious. He sees no way out if his wife does not accept him back. She did agree to a family meeting before discharge. He is tearful and apologetic. He participates in programming. There are no somatic complaints. The patient is not able to contract for safety if not allowed to return home.  Principal Problem: Adjustment disorder with depressed mood Diagnosis:   Patient Active Problem List   Diagnosis Date Noted  . Alcohol use disorder, moderate, dependence [F10.20] 07/15/2015  . Major depression [F32.2] 07/15/2015  . Adjustment disorder with depressed mood [F43.21] 07/15/2015  . VITAMIN D DEFICIENCY [E55.9] 12/25/2010  . ANEMIA-NOS [D64.9] 12/25/2010  . Tobacco use disorder [Z72.0] 12/25/2010  . 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 ABUSE, COCAINE, UNSPECIFIED [F14.10] 06/09/2007  . Peripheral vascular disease [I73.9] 06/09/2007  . GERD [K21.9] 06/09/2007   Total Time spent with patient: 20 minutes   Past Medical History:  Past Medical History  Diagnosis Date  . GERD (gastroesophageal reflux disease)   . Anemia     NOS  . PVD (peripheral vascular disease)     Past Surgical History  Procedure Laterality Date  . Fatty tissue removed from neck  2004  . Colonoscopy  2012   Family History:  Family History  Problem Relation Age of Onset  . Hypertension Father   . Kidney failure Father   . Hypertension Mother   . Cancer Sister     breast  . Heart attack Neg Hx     <55  . Hypertension Sister   . Hypertension Sister   . Hypertension Brother   . Hypertension Brother   . Cancer Mother    Social History:  History  Alcohol Use  . 3.6 oz/week  . 6 Standard drinks or equivalent per week    Comment: occasionally (3-4 12oz./weekends)     History  Drug Use  . Yes  . Special: Marijuana    Comment: no inj. drugs     Social History   Social History  . Marital Status: Married    Spouse Name: N/A  . Number of Children: 3  . Years of Education: N/A   Occupational History  . Tour manager   Social History Main Topics  . Smoking status: Current Every Day Smoker -- 0.50 packs/day    Types: Cigarettes  . Smokeless tobacco: None     Comment: quit 4 days ago, 20pyh  . Alcohol Use: 3.6 oz/week    6 Standard drinks or equivalent per week     Comment: occasionally (3-4 12oz./weekends)  . Drug Use: Yes    Special: Marijuana     Comment: no inj. drugs  . Sexual Activity: Not Asked   Other Topics Concern  . None   Social History Narrative   Regular exercise--no   Children 3 healthy   Diet: Avoids fried food, baked, occ. Fruits and veggies   Additional History:    Sleep: Good  Appetite:  Good   Assessment:   Musculoskeletal: Strength & Muscle Tone: within normal limits Gait & Station: normal Patient leans: N/A   Psychiatric Specialty Exam: Physical Exam  Nursing note and vitals reviewed.   Review of Systems  All other systems reviewed and are negative.   Blood pressure 144/81, pulse 97, temperature 98.2 F (36.8 C), temperature source Oral,  resp. rate 20, height 6' (1.829 m), weight 79.379 kg (175 lb), SpO2 100 %.Body mass index is 23.73 kg/(m^2).  General Appearance: Casual  Eye Contact::  Good  Speech:  Clear and Coherent  Volume:  Normal  Mood:  Anxious and Depressed  Affect:  Flat and Tearful  Thought Process:  Goal Directed  Orientation:  Full (Time, Place, and Person)  Thought Content:  WDL  Suicidal Thoughts:  Yes.  with intent/plan  Homicidal Thoughts:  No  Memory:  Immediate;   Fair Recent;   Fair Remote;   Fair  Judgement:  Impaired  Insight:  Shallow  Psychomotor Activity:  Decreased  Concentration:  Fair  Recall:  [employer] of Knowledge:Fair  Language: Fair  Akathisia:  No  Handed:  Right  AIMS (if indicated):     Assets:   Communication Skills Desire for Improvement Financial Resources/Insurance Physical Health Resilience  ADL's:  Intact  Cognition: WNL  Sleep:  Number of Hours: 6.75     Current Medications: Current Facility-Administered Medications  Medication Dose Route Frequency Provider Last Rate Last Dose  . acetaminophen (TYLENOL) tablet 650 mg  650 mg Oral Q6H PRN Gonzella Lex, MD      . alum & mag hydroxide-simeth (MAALOX/MYLANTA) 200-200-20 MG/5ML suspension 30 mL  30 mL Oral Q4H PRN Gonzella Lex, MD      . diphenhydrAMINE (BENADRYL) capsule 50 mg  50 mg Oral QHS PRN Rainey Pines, MD   50 mg at 07/16/15 2219  . magnesium hydroxide (MILK OF MAGNESIA) suspension 30 mL  30 mL Oral Daily PRN Gonzella Lex, MD      . nicotine (NICODERM CQ - dosed in mg/24 hours) patch 21 mg  21 mg Transdermal Daily Jolanta B Pucilowska, MD   21 mg at 07/17/15 9622    Lab Results: No results found for this or any previous visit (from the past 48 hour(s)).  Physical Findings: AIMS:  , ,  ,  ,    CIWA:    COWS:     Treatment Plan Summary: Daily contact with patient to assess and evaluate symptoms and progress in treatment and Medication management   Medical Decision Making:  Established Problem, Stable/Improving (1), Review of Psycho-Social Stressors (1), Review or order clinical lab tests (1), Review of Medication Regimen & Side Effects (2) and Review of New Medication or Change in Dosage (2)   Donald Austin is a 54 year old male with no past psychiatric history except alcoholism admitted for suicidal threats in the context of marital conflict.  1. Suicidal ideation. The patient still feels hopeless and is unable to contract for safety.   2. Mood. The patient is not ready to start any pharmacotherapy.  3. Smoking. Nicotine patch is available.   4. Alcohol abuse. Will monitor for symptoms of withdrawal.   5. Substance abuse treatment. The patient is not interested at this point   6. Disposition. He will  be discharged to the homeless shelter if necessary. Follow-up with the local provider.      Jolanta Pucilowska 07/17/2015, 2:35 PM

## 2015-07-17 NOTE — BHH Group Notes (Signed)
[place] LCSW Group Therapy  07/17/2015 4:21 PM  Type of Therapy:  Group Therapy  Participation Level:  Active  Participation Quality:  Appropriate, Attentive and Sharing  Affect:  Appropriate  Cognitive:  Alert, Appropriate and Oriented  Insight:  Engaged  Engagement in Therapy:  Engaged and Supportive  Modes of Intervention:  Discussion  Summary of Progress/Problems: Patient attended the group and was observed actively participating throughout the session. Patient discussed identified stressors in life and how to apply boundaries to maintain balance. Patient was supportive to other group members.  Keene Breath, MSW, LCSWA  07/17/2015, 4:21 PM

## 2015-07-18 NOTE — BHH Group Notes (Signed)
[place] Group Notes:  (Nursing/MHT/Case Management/Adjunct)  Date:  07/18/2015  Time:  11:47 AM  Type of Therapy:  Psychoeducational Skills  Participation Level:  Active  Participation Quality:  Appropriate  Affect:  Appropriate  Cognitive:  Appropriate  Insight:  Good  Engagement in Group:  Engaged  Modes of Intervention:  Socialization  Summary of Progress/Problems:  Drake Leach 07/18/2015, 11:47 AM

## 2015-07-18 NOTE — Progress Notes (Signed)
Recreation Therapy Notes  INPATIENT RECREATION TR PLAN  Patient Details Name: GENESIS PAGET MRN: [id number] DOB: 06/20/1961 Today's Date: 07/18/2015  Rec Therapy Plan Is patient appropriate for Therapeutic Recreation?: Yes Treatment times per week: At least once a week TR Treatment/Interventions: 1:1 session, Group participation (Comment) (Appropriate participation in daily recreation therapy tx)  Discharge Criteria Pt will be discharged from therapy if:: Discharged Treatment plan/goals/alternatives discussed and agreed upon by:: Patient/family  Discharge Summary Short term goals set: See Care Plan Short term goals met: Complete Progress toward goals comments: One-to-one attended Which groups?: Leisure education, Self-esteem One-to-one attended: Self-esteem, stress management, coping skills Reason goals not met: N/A Therapeutic equipment acquired: None Reason patient discharged from therapy: Discharge from hospital Pt/family agrees with progress & goals achieved: Yes Date patient discharged from therapy: 07/18/15   Leonette Monarch, LRT/CTRS 07/18/2015, 5:57 PM

## 2015-07-18 NOTE — Progress Notes (Signed)
D: Patient denies SI/HI/AVH.  Patient affect his sad and his mood is depressed.  Patient did attend evening group. Patient visible on the milieu. No distress noted. A: Support and encouragement offered. Scheduled medications given to pt. Q 15 min checks continued for patient safety. R: Patient receptive. Patient remains safe on the unit.

## 2015-07-18 NOTE — Plan of Care (Signed)
Problem: [facility] Participation in Recreation Therapeutic Interventions Goal: STG-Patient will demonstrate improved self esteem by identif STG: Self-Esteem - Within 3 treatment sessions, patient will verbalize at least 5 positive affirmation statements in each of 2 treatment sessions to increase self-esteem post d/c.  Outcome: Completed/Met Date Met:  07/18/15 Treatment Session 2; Completed 2 out of 2: At approximately 11:25 am, LRT met with patient in patient room. Patient verbalized 5 positive affirmation statements. Patient reported it felt "good". LRT encouraged patient to continue saying positive affirmation statements.  Leonette Monarch, LRT/CTRS 09.23.16 5:54 pm Goal: STG-Patient will identify at least five coping skills for ** STG: Coping Skills - Within 3 treatment sessions, patient will verbalize at least 5 coping skills for substance abuse in each of 2 treatment sessions to decrease substance abuse post d/c.  Outcome: Completed/Met Date Met:  07/18/15 Treatment Session 2; Completed 2 out of 2: At approximately 11:25 am, LRT met with patient in patient room. Patient verbalized 5 coping skills for substance abuse. LRT encouraged patient to participate in leisure activities.  Leonette Monarch, LRT/CTRS 09.23.16 5:55 pm Goal: STG-Other Recreation Therapy Goal (Specify) STG: Stress Management - Within 3 treatment sessions, patient will verbalize understanding of the stress management techniques in each of 2 treatment sessions to increase stress management skills post d/c.  Outcome: Completed/Met Date Met:  07/18/15 Treatment Session 2; Completed 2 out of 2: At approximately 11:25 am, LRT met with patient in patient room. Patient reported he read some of the stress management techniques. Patient verbalized understanding of the stress management techniques. LRT encouraged patient to continue reading and to practice the stress management techniques.  Leonette Monarch, LRT/CTRS 09.23.16 5:56  pm

## 2015-07-18 NOTE — Progress Notes (Signed)
Discharge Note: Patient is oriented x 4. Reports that he "is feeling much better". Patient denies suicidal or homicidal thoughts. Denies AH or VH. He was given AA meeting list and follow up appointments. Verbalized understanding also given work note.  Patient's FMLA paperwork are currently not in his chart. He reports that he will have HR refax info.   All of pt's belongings returned to him. Patient was escorted off unitby staff. Reports that his car is in the parking lot. No distress noted.

## 2015-07-18 NOTE — Tx Team (Signed)
Interdisciplinary Treatment Plan Update (Adult)  Date:  07/18/2015 Time Reviewed:  12:37 PM  Progress in Treatment: Attending groups: Yes. Participating in groups:  Yes. Taking medication as prescribed:  Yes. Tolerating medication:  Yes. Family/Significant othe contact made:  Yes, individual(s) contacted:  patients wife Rohit Deloria [phone] Patient understands diagnosis:  Yes. Discussing patient identified problems/goals with staff:  Yes. Medical problems stabilized or resolved:  Yes. Denies suicidal/homicidal ideation: Yes. Issues/concerns per patient self-inventory:  No. Other:  New problem(s) identified: No, Describe:  none reported  Discharge Plan or Barriers: Patient will dsicahrge home with his wife Reeves Forth and followup at [facility]  Reason for Continuation of Hospitalization: Depression Suicidal ideation  Comments:  Estimated length of stay: up to 1 day with expected discharge Friday 07/18/15  New goal(s):  Review of initial/current patient goals per problem list:   1.  Goal(s):decrease depressive symptoms  Met:  No  Target date: 07/18/15  As evidenced by:  2.  Goal (s): eliminate SI  Met:  Yes   Attendees: Physician:  Orson Slick, MD 9/23/201612:37 PM  Nursing:   Elige Radon, RN 9/23/201612:37 PM  Other:  Carmell Austria, LCSWA 9/23/201612:37 PM  Other:   9/23/201612:37 PM  Other:   9/23/201612:37 PM  Other:  9/23/201612:37 PM  Other:  9/23/201612:37 PM  Other:  9/23/201612:37 PM  Other:  9/23/201612:37 PM  Other:  9/23/201612:37 PM  Other:  9/23/201612:37 PM  Other:   9/23/201612:37 PM   Scribe for Treatment Team:   Keene Breath, MSW, LCSWA  07/18/2015, 12:37 PM

## 2015-07-18 NOTE — Progress Notes (Signed)
  [facility] Adult Case Management Discharge Plan :  Will you be returning to the same living situation after discharge:  Yes,  home with wife Keena Heesch [phone] At discharge, do you have transportation home?: Yes,  patient has a car in the parking lot Do you have the ability to pay for your medications: Yes,  patient has insurance  Release of information consent forms completed and in the chart;  Patient's signature needed at discharge.  Patient to Follow up at: Follow-up Information    Follow up with [facility]. Go on 07/21/2015.   Why:  For follow-up care appt within 7 days of discharge patient will walk in M-F at 9:00am. Walk in hrs are M-F 9-3. Early walk in gives best opportunity to be seen that day.    Contact information:   [place], [place] [phone] Fax [phone]      Patient denies SI/HI: Yes,  patient denies SI/HI    Safety Planning and Suicide Prevention discussed: Yes,  SPE discussed with patient and his wife Lonnie Reth  Have you used any form of tobacco in the last 30 days? (Cigarettes, Smokeless Tobacco, Cigars, and/or Pipes): Yes  Has patient been referred to the Quitline?: Patient refused referral  Keene Breath, MSW, LCSWA 07/18/2015, 12:36 PM

## 2015-07-18 NOTE — [facility] Suicide Risk Assessment (Signed)
[facility] INPATIENT:  Family/Significant Other Suicide Prevention Education  Suicide Prevention Education:  Education Completed; Melvern Ramone (wife) [phone] has been identified by the patient as the family member/significant other with whom the patient will be residing, and identified as the person(s) who will aid the patient in the event of a mental health crisis (suicidal ideations/suicide attempt).  With written consent from the patient, the family member/significant other has been provided the following suicide prevention education, prior to the and/or following the discharge of the patient.  The suicide prevention education provided includes the following:  Suicide risk factors  Suicide prevention and interventions  National Suicide Hotline telephone number  [facility] assessment telephone number  [facility] Emergency Assistance [place] and/or Residential Mobile Crisis Unit telephone number  Request made of family/significant other to:  Remove weapons (e.g., guns, rifles, knives), all items previously/currently identified as safety concern.    Remove drugs/medications (over-the-counter, prescriptions, illicit drugs), all items previously/currently identified as a safety concern.  The family member/significant other verbalizes understanding of the suicide prevention education information provided.  The family member/significant other agrees to remove the items of safety concern listed above.  Keene Breath, MSW, LCSWA 07/18/2015, 12:36 PM
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
## 2015-07-18 NOTE — [facility] Suicide Risk Assessment (Signed)
[facility] Discharge Suicide Risk Assessment   Demographic Factors:  Male  Total Time spent with patient: 30 minutes  Musculoskeletal: Strength & Muscle Tone: within normal limits Gait & Station: normal Patient leans: N/A  Psychiatric Specialty Exam: Physical Exam  Nursing note and vitals reviewed.   Review of Systems  All other systems reviewed and are negative.   Blood pressure 110/77, pulse 80, temperature 98.4 F (36.9 C), temperature source Oral, resp. rate 20, height 6' (1.829 m), weight 79.379 kg (175 lb), SpO2 100 %.Body mass index is 23.73 kg/(m^2).  General Appearance: Casual  Eye Contact::  Good  Speech:  Clear and [id number]  Volume:  Normal  Mood:  Euthymic  Affect:  Appropriate  Thought Process:  Goal Directed  Orientation:  Full (Time, Place, and Person)  Thought Content:  WDL  Suicidal Thoughts:  No  Homicidal Thoughts:  No  Memory:  Immediate;   Fair Recent;   Fair Remote;   Fair  Judgement:  Fair  Insight:  Fair  Psychomotor Activity:  Normal  Concentration:  Fair  Recall:  [employer] [place]  Language: Fair  Akathisia:  No  Handed:  Right  AIMS (if indicated):     Assets:  Communication Skills Desire for Improvement Financial Resources/Insurance Housing Physical Health Resilience  Sleep:  Number of Hours: 5.75  Cognition: WNL  ADL's:  Intact   Have you used any form of tobacco in the last 30 days? (Cigarettes, Smokeless Tobacco, Cigars, and/or Pipes): Yes  Has this patient used any form of tobacco in the last 30 days? (Cigarettes, Smokeless Tobacco, Cigars, and/or Pipes) Yes, A prescription for an FDA-approved tobacco cessation medication was offered at discharge and the patient refused  Mental Status Per Nursing Assessment::   On Admission:     Current Mental Status by Physician: NA  Loss Factors: Loss of significant relationship, Legal issues and Financial problems/change in socioeconomic status  Historical  Factors: Impulsivity  Risk Reduction Factors:   Responsible for children under 50 years of age, Sense of responsibility to family, Religious beliefs about death, Employed and Living with another person, especially a relative  Continued Clinical Symptoms:  Alcohol/Substance Abuse/Dependencies  Cognitive Features That Contribute To Risk:  None    Suicide Risk:  Minimal: No identifiable suicidal ideation.  Patients presenting with no risk factors but with morbid ruminations; may be classified as minimal risk based on the severity of the depressive symptoms  Principal Problem: Adjustment disorder with depressed mood Discharge Diagnoses:  Patient Active Problem List   Diagnosis Date Noted  . Alcohol use disorder, moderate, dependence [F10.20] 07/15/2015  . Major depression [F32.2] 07/15/2015  . Adjustment disorder with depressed mood [F43.21] 07/15/2015  . VITAMIN D DEFICIENCY [E55.9] 12/25/2010  . ANEMIA-NOS [D64.9] 12/25/2010  . Tobacco use disorder [Z72.0] 12/25/2010  . 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 ABUSE, COCAINE, UNSPECIFIED [F14.10] 06/09/2007  . Peripheral vascular disease [I73.9] 06/09/2007  . GERD [K21.9] 06/09/2007      Plan Of Care/Follow-up recommendations:  Activity:  As tolerated. Diet:  Low sodium heart healthy. Other:  He'll follow-up appointments.  Is patient on multiple antipsychotic therapies at discharge:  No   Has Patient had three or more failed trials of antipsychotic monotherapy by history:  No  Recommended Plan for Multiple Antipsychotic Therapies: NA    Jolanta Pucilowska 07/18/2015, 10:45 AM

## 2016-05-20 ENCOUNTER — Other Ambulatory Visit: Payer: Self-pay | Admitting: Orthopedic Surgery

## 2016-05-20 DIAGNOSIS — R2240 Localized swelling, mass and lump, unspecified lower limb: Secondary | ICD-10-CM

## 2016-05-21 ENCOUNTER — Ambulatory Visit: Admission: RE | Admit: 2016-05-21 | Payer: Managed Care, Other (non HMO) | Source: Ambulatory Visit

## 2016-05-27 ENCOUNTER — Other Ambulatory Visit: Payer: Self-pay | Admitting: Orthopedic Surgery

## 2016-06-09 ENCOUNTER — Ambulatory Visit: Payer: Managed Care, Other (non HMO)

## 2018-05-01 ENCOUNTER — Other Ambulatory Visit: Payer: Self-pay

## 2018-05-01 DIAGNOSIS — R200 Anesthesia of skin: Secondary | ICD-10-CM | POA: Insufficient documentation

## 2018-05-01 DIAGNOSIS — F17210 Nicotine dependence, cigarettes, uncomplicated: Secondary | ICD-10-CM | POA: Insufficient documentation

## 2018-05-01 LAB — URINALYSIS, COMPLETE (UACMP) WITH MICROSCOPIC
BACTERIA UA: NONE SEEN
BILIRUBIN URINE: NEGATIVE
Glucose, UA: NEGATIVE mg/dL
Hgb urine dipstick: NEGATIVE
KETONES UR: NEGATIVE mg/dL
LEUKOCYTES UA: NEGATIVE
Nitrite: NEGATIVE
PROTEIN: NEGATIVE mg/dL
SPECIFIC GRAVITY, URINE: 1.02 (ref 1.005–1.030)
Squamous Epithelial / LPF: NONE SEEN (ref 0–5)
pH: 6 (ref 5.0–8.0)

## 2018-05-01 LAB — BASIC METABOLIC PANEL
Anion gap: 5 (ref 5–15)
BUN: 17 mg/dL (ref 6–20)
CHLORIDE: 105 mmol/L (ref 98–111)
CO2: 29 mmol/L (ref 22–32)
Calcium: 8.9 mg/dL (ref 8.9–10.3)
Creatinine, Ser: 1.06 mg/dL (ref 0.61–1.24)
GFR calc Af Amer: >60 mL/min (ref >60)
GFR calc non Af Amer: >60 mL/min (ref >60)
GLUCOSE: 92 mg/dL (ref 70–99)
Potassium: 4.1 mmol/L (ref 3.5–5.1)
SODIUM: 139 mmol/L (ref 135–145)

## 2018-05-01 LAB — CBC
HCT: 45.2 % (ref 40.0–52.0)
Hemoglobin: 15.1 g/dL (ref 13.0–18.0)
MCH: 30 pg (ref 26.0–34.0)
MCHC: 33.5 g/dL (ref 32.0–36.0)
MCV: 89.6 fL (ref 80.0–100.0)
Platelets: 212 10*3/uL (ref 150–440)
RBC: 5.05 MIL/uL (ref 4.40–5.90)
RDW: 13.7 % (ref 11.5–14.5)
WBC: 7.5 10*3/uL (ref 3.8–10.6)

## 2018-05-01 NOTE — ED Triage Notes (Addendum)
Pt arrives to ED via POV from home with c/o right arm numbness and left calf numbness x1 month. Pt denies any c/o visual changes; no slurred speech, no facial droop; pt MAEW, grips strong and equal bilaterally. Pt denies any c/o N/V/D, no chest pain or SHOB.

## 2018-05-02 ENCOUNTER — Emergency Department
Admission: EM | Admit: 2018-05-02 | Discharge: 2018-05-02 | Disposition: A | Discharge status: Home / Self Care | Payer: Self-pay | Attending: Emergency Medicine | Admitting: Emergency Medicine

## 2018-05-02 ENCOUNTER — Emergency Department: Payer: Self-pay

## 2018-05-02 DIAGNOSIS — R200 Anesthesia of skin: Secondary | ICD-10-CM

## 2018-05-02 NOTE — Discharge Instructions (Addendum)
While today your blood work and your chest x-ray were reassuring it is critically important that you make an appointment to establish care with a primary care physician within this next week for reevaluation.  Return to the emergency department sooner for any concerns whatsoever.  It was a pleasure to take care of you today, and thank you for coming to our emergency department.  If you have any questions or concerns before leaving please ask the nurse to grab me and I'm more than happy to go through your aftercare instructions again.  If you were prescribed any opioid pain medication today such as Norco, Vicodin, Percocet, morphine, hydrocodone, or oxycodone please make sure you do not drive when you are taking this medication as it can alter your ability to drive safely.  If you have any concerns once you are home that you are not improving or are in fact getting worse before you can make it to your follow-up appointment, please do not hesitate to call 911 and come back for further evaluation.  Darel Hong, MD  Results for orders placed or performed during the hospital encounter of 57/01/77  Basic metabolic panel  Result Value Ref Range   Sodium 139 135 - 145 mmol/L   Potassium 4.1 3.5 - 5.1 mmol/L   Chloride 105 98 - 111 mmol/L   CO2 29 22 - 32 mmol/L   Glucose, Bld 92 70 - 99 mg/dL   BUN 17 6 - 20 mg/dL   Creatinine, Ser 1.06 0.61 - 1.24 mg/dL   Calcium 8.9 8.9 - 10.3 mg/dL   GFR calc non Af Amer >60 >60 mL/min   GFR calc Af Amer >60 >60 mL/min   Anion gap 5 5 - 15  CBC  Result Value Ref Range   WBC 7.5 3.8 - 10.6 K/uL   RBC 5.05 4.40 - 5.90 MIL/uL   Hemoglobin 15.1 13.0 - 18.0 g/dL   HCT 45.2 40.0 - 52.0 %   MCV 89.6 80.0 - 100.0 fL   MCH 30.0 26.0 - 34.0 pg   MCHC 33.5 32.0 - 36.0 g/dL   RDW 13.7 11.5 - 14.5 %   Platelets 212 150 - 440 K/uL  Urinalysis, Complete w Microscopic  Result Value Ref Range   Color, Urine YELLOW (A) YELLOW   APPearance CLEAR (A) CLEAR   Specific Gravity, Urine 1.020 1.005 - 1.030   pH 6.0 5.0 - 8.0   Glucose, UA NEGATIVE NEGATIVE mg/dL   Hgb urine dipstick NEGATIVE NEGATIVE   Bilirubin Urine NEGATIVE NEGATIVE   Ketones, ur NEGATIVE NEGATIVE mg/dL   Protein, ur NEGATIVE NEGATIVE mg/dL   Nitrite NEGATIVE NEGATIVE   Leukocytes, UA NEGATIVE NEGATIVE   RBC / HPF 0-5 0 - 5 RBC/hpf   WBC, UA 0-5 0 - 5 WBC/hpf   Bacteria, UA NONE SEEN NONE SEEN   Squamous Epithelial / LPF NONE SEEN 0 - 5   Mucus PRESENT

## 2018-05-02 NOTE — ED Notes (Signed)

## 2018-05-02 NOTE — ED Provider Notes (Signed)
[facility] Emergency Department Provider Note  ____________________________________________   First MD Initiated Contact with Patient 05/02/18 [phone]     (approximate)  I have reviewed the triage vital signs and the nursing notes.   HISTORY  Chief Complaint Numbness   HPI Donald Austin is a 57 y.o. male is self presents to the emergency department with roughly 1 month of intermittent right arm numbness.  It does not seem radicular and it is his entire arm.  No weakness.  Is somewhat worse when moving his arm particularly when raising it above his head.  His hands never changes colors.  He has not dropped anything.  He denies trauma.  There is no change in his symptoms today.  He has not sought medical care until today.  There is no particular reason he came to the emergency department today other than frustration.  He denies chest pain or shortness of breath.  He denies double vision or blurred vision.  He is a smoker.  He denies cough or hemoptysis.  He denies unintentional weight loss.  His symptoms are mild to moderate intermittent.    Past Medical History:  Diagnosis Date  . Anemia    NOS  . GERD (gastroesophageal reflux disease)   . PVD (peripheral vascular disease) [facility])     Patient Active Problem List   Diagnosis Date Noted  . Alcohol use disorder, moderate, dependence ([place]) 07/15/2015  . Major depression 07/15/2015  . Adjustment disorder with depressed mood 07/15/2015  . VITAMIN D DEFICIENCY 12/25/2010  . ANEMIA-NOS 12/25/2010  . Tobacco use disorder 12/25/2010  . HYPERTROPHY PROSTATE W/UR OBST & OTH LUTS 12/25/2010  . ABUSE, COCAINE, UNSPECIFIED 06/09/2007  . Peripheral vascular disease ([place]) 06/09/2007  . GERD 06/09/2007    Past Surgical History:  Procedure Laterality Date  . COLONOSCOPY  2012  . fatty tissue removed from neck  2004    Prior to Admission medications   Not on File    Allergies Patient has no known  allergies.  Family History  Problem Relation Age of Onset  . Hypertension Father   . Kidney failure Father   . Hypertension Mother   . Cancer Mother   . Cancer Sister        breast  . Hypertension Sister   . Hypertension Sister   . Hypertension Brother   . Hypertension Brother   . Heart attack Neg Hx        <55    Social History Social History   Tobacco Use  . Smoking status: Current Every Day Smoker    Packs/day: 0.50    Types: Cigarettes  . Smokeless tobacco: Never Used  . Tobacco comment: quit 4 days ago, 20pyh  Substance Use Topics  . Alcohol use: Yes    Alcohol/week: 3.6 oz    Types: 6 Standard drinks or equivalent per week    Comment: occasionally (3-4 12oz./weekends)  . Drug use: Yes    Types: Marijuana    Comment: no inj. drugs    Review of Systems Constitutional: No fever/chills Eyes: No visual changes. ENT: No sore throat. Cardiovascular: Denies chest pain. Respiratory: Denies shortness of breath. Gastrointestinal: No abdominal pain.  No nausea, no vomiting.  No diarrhea.  No constipation. Genitourinary: Negative for dysuria. Musculoskeletal: Negative for back pain. Skin: Negative for rash. Neurological: Positive for focal numbness   ____________________________________________   PHYSICAL EXAM:  VITAL SIGNS: ED Triage Vitals  Enc Vitals Group     BP 05/01/18  2215 (!) 133/92     Pulse Rate 05/01/18 2215 71     Resp 05/01/18 2215 18     Temp 05/01/18 2215 98.7 F (37.1 C)     Temp Source 05/01/18 2215 Oral     SpO2 05/01/18 2215 98 %     Weight 05/01/18 2216 180 lb (81.6 kg)     Height 05/01/18 2216 6' (1.829 m)     Head Circumference --      Peak Flow --      Pain Score 05/01/18 2216 0     Pain Loc --      Pain Edu? --      Excl. in [place]? --     Constitutional: Alert and oriented x4 nontoxic no diaphoresis speaks full clear sentences Eyes: PERRL EOMI. Head: Atraumatic. Nose: No congestion/rhinnorhea. Mouth/Throat: No trismus Neck:  No stridor.   Cardiovascular: Normal rate, regular rhythm. Grossly normal heart sounds.  Good peripheral circulation. Respiratory: Normal respiratory effort.  No retractions. Lungs CTAB and moving good air Gastrointestinal: Soft nontender Musculoskeletal: No lower extremity edema   Neurologic:  Normal speech and language.  No pronator drift 5 out of 5 grips biceps triceps hip flexion hip extension sensation intact light touch throughout Skin:  Skin is warm, dry and intact. No rash noted. Psychiatric: Mood and affect are normal. Speech and behavior are normal.    ____________________________________________   DIFFERENTIAL includes but not limited to  Thoracic outlet syndrome, radicular numbness, stroke, brachial plexus injury ____________________________________________   LABS (all labs ordered are listed, but only abnormal results are displayed)  Labs Reviewed  URINALYSIS, COMPLETE (UACMP) WITH MICROSCOPIC - Abnormal; Notable for the following components:      Result Value   Color, Urine YELLOW (*)    APPearance CLEAR (*)    All other components within normal limits  BASIC METABOLIC PANEL  CBC    Lab work reviewed by me with no acute disease __________________________________________  EKG   ____________________________________________  RADIOLOGY  Chest x-ray reviewed by me with no acute disease ____________________________________________   PROCEDURES  Procedure(s) performed: no  Procedures  Critical Care performed: no  ____________________________________________   INITIAL IMPRESSION / ASSESSMENT AND PLAN / ED COURSE  Pertinent labs & imaging results that were available during my care of the patient were reviewed by me and considered in my medical decision making (see chart for details).   The patient's symptoms do not seem particularly radicular.  He does have a strong pulse in his hand and he has not been losing any strength.  Unclear etiology but he  still very well could have thoracic outlet syndrome versus a brachial plexus injury.  Chest x-ray reassuring with no Pancoast tumor.  I will refer him back to primary care.  Discharged home in stable condition verbalizes understanding and agreement with plan.      ____________________________________________   FINAL CLINICAL IMPRESSION(S) / ED DIAGNOSES  Final diagnoses:  Numbness      NEW MEDICATIONS STARTED DURING THIS VISIT:  There are no discharge medications for this patient.    Note:  This document was prepared using Dragon voice recognition software and may include unintentional dictation errors.     Darel Hong, MD 05/04/18 [phone]

## 2018-07-11 ENCOUNTER — Ambulatory Visit: Payer: Self-pay | Admitting: Internal Medicine

## 2018-07-11 NOTE — Telephone Encounter (Signed)
Message from Bea Graff, NT sent at 07/11/2018 10:50 AM EDT   Summary: bp readings    Pt wanting advise about his blood pressure readings. He states at his appt this morning with another office his BP readings were 128/95 and then 134/94. He is concerned that the readings are to high. He would like to speak with a nurse regarding.

## 2018-07-11 NOTE — Telephone Encounter (Signed)
Patient called in and says "I went to the [place] clinic today and they told me my BP was 128/95, then 138/94 and told me to see my doctor about it. I've never been told to get my BP checked out, so I'm concerned." I asked about symptoms, he says "I do have dizziness sometimes, my head feels foggy. I don't have a headache. My vision is blurry in the morning. I just feel anxious all the time." I asked how severe is the dizziness, he says "not that bad, I can walk normal." According to protocol, see PCP within 3 days, patient has a new patient appointment already scheduled for Thursday, 07/13/18 at 0930 with Webb Silversmith, FNP. I advised the patient to come to that appointment that the BP and symptoms he's having doesn't require emergent evaluation at this time. I advised to keep a record of his BP over the next day and if the bottom number goes up above 100 and if his symptoms become more pronounced and worse, go to the ED for evaluation, he verbalized understanding.  Reason for Disposition . [1] MILD dizziness (e.g., walking normally) AND [2] has NOT been evaluated by physician for this  (Exception: dizziness caused by heat exposure, sudden standing, or poor fluid intake)  Answer Assessment - Initial Assessment Questions 1. BLOOD PRESSURE: "What is the blood pressure?" "Did you take at least two measurements 5 minutes apart?"     128/95, 138/94 2. ONSET: "When did you take your blood pressure?"     It was taken today at [facility] 3. HOW: "How did you obtain the blood pressure?" (e.g., visiting nurse, automatic home BP monitor)     Automatic 4. HISTORY: "Do you have a history of high blood pressure?"     No 5. MEDICATIONS: "Are you taking any medications for blood pressure?" "Have you missed any doses recently?"     No 6. OTHER SYMPTOMS: "Do you have any symptoms?" (e.g., headache, chest pain, blurred vision, difficulty breathing, weakness)     Foggy in the head, slight dizzy often, blurred  vision in the morning 7. PREGNANCY: "Is there any chance you are pregnant?" "When was your last menstrual period?"     N/A  Answer Assessment - Initial Assessment Questions 1. DESCRIPTION: "Describe your dizziness."     Foggy in the head 2. LIGHTHEADED: "Do you feel lightheaded?" (e.g., somewhat faint, woozy, weak upon standing)     Lightheaded 3. VERTIGO: "Do you feel like either you or the room is spinning or tilting?" (i.e. vertigo)     No 4. SEVERITY: "How bad is it?"  "Do you feel like you are going to faint?" "Can you stand and walk?"   - MILD - walking normally   - MODERATE - interferes with normal activities (e.g., work, school)    - SEVERE - unable to stand, requires support to walk, feels like passing out now.      Mild 5. ONSET:  "When did the dizziness begin?"     It's not all the time 6. AGGRAVATING FACTORS: "Does anything make it worse?" (e.g., standing, change in head position)     I really don't know 7. HEART RATE: "Can you tell me your heart rate?" "How many beats in 15 seconds?"  (Note: not all patients can do this)       N/A 8. CAUSE: "What do you think is causing the dizziness?"     I don't know 9. RECURRENT SYMPTOM: "Have you had dizziness before?" If so,  ask: "When was the last time?" "What happened that time?"     No 10. OTHER SYMPTOMS: "Do you have any other symptoms?" (e.g., fever, chest pain, vomiting, diarrhea, bleeding)       Blurred vision in the morning 11. PREGNANCY: "Is there any chance you are pregnant?" "When was your last menstrual period?"       N/A  Protocols used: DIZZINESS - LIGHTHEADEDNESS-A-AH, HIGH BLOOD PRESSURE-A-AH

## 2018-07-13 ENCOUNTER — Encounter: Payer: Self-pay | Admitting: Internal Medicine

## 2018-07-13 ENCOUNTER — Ambulatory Visit (INDEPENDENT_AMBULATORY_CARE_PROVIDER_SITE_OTHER): Payer: PRIVATE HEALTH INSURANCE | Admitting: Internal Medicine

## 2018-07-13 VITALS — BP 130/82 | HR 78 | Temp 98.1°F | Wt 196.0 lb

## 2018-07-13 DIAGNOSIS — I739 Peripheral vascular disease, unspecified: Secondary | ICD-10-CM | POA: Diagnosis not present

## 2018-07-13 DIAGNOSIS — D538 Other specified nutritional anemias: Secondary | ICD-10-CM | POA: Diagnosis not present

## 2018-07-13 DIAGNOSIS — N529 Male erectile dysfunction, unspecified: Secondary | ICD-10-CM | POA: Insufficient documentation

## 2018-07-13 DIAGNOSIS — R202 Paresthesia of skin: Secondary | ICD-10-CM

## 2018-07-13 DIAGNOSIS — K219 Gastro-esophageal reflux disease without esophagitis: Secondary | ICD-10-CM

## 2018-07-13 DIAGNOSIS — D1723 Benign lipomatous neoplasm of skin and subcutaneous tissue of right leg: Secondary | ICD-10-CM

## 2018-07-13 NOTE — Assessment & Plan Note (Signed)
Encouraged him to avoid foods that trigger his reflux If occurring > 3 days per week, he should take Nexium daily

## 2018-07-13 NOTE — Assessment & Plan Note (Signed)
Continue Cialis prn 

## 2018-07-13 NOTE — Assessment & Plan Note (Signed)
Discussed taking baby ASA daily Encouraged smoking cessation, he refuses

## 2018-07-13 NOTE — Progress Notes (Signed)
HPI  Pt presents to the clinic today to establish care and for management of the conditions listed below. He has not had a PCP in many years.  Anemia: His last H/H was 15.1/45.2. He is not taking any iron supplement OTC. He denies any s/s of bleeding.  GERD: Triggered by tomato based and spicy foods. He denies breakthrough on Esomeprazole as needed. There is no upper GI on file.  PVD: He denies symptoms of claudication. He had vascular studies done 05/2015 by Dr. Harl Bowie.   ED: He is able to initiate an erection but unable to maintain an erection. He takes Cialis as needed.  He also c/o intermittent right arm numbness. He is following with neurology and has a EMG scheduled for tomorrow.  He also reports a lump of his right lower thigh. He reports this has been there for many years. It has gotten bigger and is uncomfortable at times. He does have a history of lipomas.  Flu: 07/2015 Tetanus: unsure Pneumovax: 06/2015 PSA Screening: never Colon Screening: 10/2009 Vision Screening: annually Dentist: as needed   Past Medical History:  Diagnosis Date  . Anemia    NOS  . GERD (gastroesophageal reflux disease)   . PVD (peripheral vascular disease) (HCC)     Current Outpatient Medications  Medication Sig Dispense Refill  . esomeprazole (NEXIUM) 40 MG capsule Take 40 mg by mouth as needed.     . tadalafil (CIALIS) 20 MG tablet Take 1 tablet by mouth as directed.     No current facility-administered medications for this visit.     No Known Allergies  Family History  Problem Relation Age of Onset  . Hypertension Father   . Kidney failure Father   . Hypertension Mother   . Cancer Sister        breast  . Hypertension Sister   . Hypertension Sister   . Hypertension Brother   . Hypertension Brother   . Heart attack Neg Hx        <55    Social History   Socioeconomic History  . Marital status: Married    Spouse name: Not on file  . Number of children: 3  . Years of  education: Not on file  . Highest education level: Not on file  Occupational History  . Occupation: Ecologist    Comment: [employer]  . Financial resource strain: Not on file  . Food insecurity:    Worry: Not on file    Inability: Not on file  . Transportation needs:    Medical: Not on file    Non-medical: Not on file  Tobacco Use  . Smoking status: Current Every Day Smoker    Packs/day: 1.00    Types: Cigarettes  . Smokeless tobacco: Never Used  . Tobacco comment: quit 4 days ago, 20pyh  Substance and Sexual Activity  . Alcohol use: Yes    Alcohol/week: 6.0 standard drinks    Types: 6 Standard drinks or equivalent per week    Comment: occasional-daily  . Drug use: Not Currently    Types: Marijuana  . Sexual activity: Not on file  Lifestyle  . Physical activity:    Days per week: Not on file    Minutes per session: Not on file  . Stress: Not on file  Relationships  . Social connections:    Talks on phone: Not on file    Gets together: Not on file    Attends religious service: Not on file  Active member of club or organization: Not on file    Attends meetings of clubs or organizations: Not on file    Relationship status: Not on file  . Intimate partner violence:    Fear of current or ex partner: Not on file    Emotionally abused: Not on file    Physically abused: Not on file    Forced sexual activity: Not on file  Other Topics Concern  . Not on file  Social History Narrative   Regular exercise--no   Children 3 healthy   Diet: Avoids fried food, baked, occ. Fruits and veggies    ROS:  Constitutional: Denies fever, malaise, fatigue, headache or abrupt weight changes.  HEENT: Denies eye pain, eye redness, ear pain, ringing in the ears, wax buildup, runny nose, nasal congestion, bloody nose, or sore throat. Respiratory: Denies difficulty breathing, shortness of breath, cough or sputum production.   Cardiovascular: Denies chest pain, chest  tightness, palpitations or swelling in the hands or feet.  Gastrointestinal: Pt reports intermittent reflux. Denies abdominal pain, bloating, constipation, diarrhea or blood in the stool.  GU: Denies frequency, urgency, pain with urination, blood in urine, odor or discharge. Musculoskeletal: Denies decrease in range of motion, difficulty with gait, muscle pain or joint pain and swelling.  Skin: Pt reports mass of right thigh. Denies redness, rashes, lesions or ulcercations.  Neurological: Pt reports numbness of right arm. Denies dizziness, difficulty with memory, difficulty with speech or problems with balance and coordination.  Psych: Denies anxiety, depression, SI/HI.  No other specific complaints in a complete review of systems (except as listed in HPI above).  PE:  BP 130/82   Pulse 78   Temp 98.1 F (36.7 C) (Oral)   Wt 196 lb (88.9 kg)   SpO2 97%   BMI 26.58 kg/m   Wt Readings from Last 3 Encounters:  07/13/18 196 lb (88.9 kg)  05/01/18 180 lb (81.6 kg)  07/14/15 175 lb (79.4 kg)     General: Appears his stated age, well developed, well nourished in NAD. Skin: 2 cm lipoma noted just above right knee. Cardiovascular: Normal rate and rhythm. S1,S2 noted.  No murmur, rubs or gallops noted.  Pulmonary/Chest: Normal effort and positive vesicular breath sounds. No respiratory distress. No wheezes, rales or ronchi noted.  Abdomen: Soft and nontender. Normal bowel sounds. Neurological: Alert and oriented.  Psychiatric: Mood and affect normal. Behavior is normal. Judgment and thought content normal.     BMET    Component Value Date/Time   NA 139 05/01/2018 2221   K 4.1 05/01/2018 2221   CL 105 05/01/2018 2221   CO2 29 05/01/2018 2221   GLUCOSE 92 05/01/2018 2221   BUN 17 05/01/2018 2221   CREATININE 1.06 05/01/2018 2221   CALCIUM 8.9 05/01/2018 2221   GFRNONAA >60 05/01/2018 2221   GFRAA >60 05/01/2018 2221    Lipid Panel     Component Value Date/Time   CHOL 125  12/23/2006 0922   TRIG 42 12/23/2006 0922   HDL 38.3 (L) 12/23/2006 0922   CHOLHDL 3.3 CALC 12/23/2006 0922   VLDL 8 12/23/2006 0922   LDLCALC 78 12/23/2006 0922    CBC    Component Value Date/Time   WBC 7.5 05/01/2018 2221   RBC 5.05 05/01/2018 2221   HGB 15.1 05/01/2018 2221   HCT 45.2 05/01/2018 2221   PLT 212 05/01/2018 2221   MCV 89.6 05/01/2018 2221   MCH 30.0 05/01/2018 2221   MCHC 33.5 05/01/2018 2221  RDW 13.7 05/01/2018 2221   LYMPHSABS 2.5 12/25/2010 1643   MONOABS 0.7 12/25/2010 1643   EOSABS 0.1 12/25/2010 1643   BASOSABS 0.0 12/25/2010 1643    Hgb A1C No results found for: HGBA1C   Assessment and Plan:  Lipoma:  Advised him we can observe this for now Can refer to general surgery at any time for removal  Paresthesia of RUE:  Following with neurology EMG scheduled for tomorrow  Make an appt for your annual exam Webb Silversmith, NP

## 2018-07-13 NOTE — Patient Instructions (Signed)
Food Choices for Gastroesophageal Reflux Disease, Adult When you have gastroesophageal reflux disease (GERD), the foods you eat and your eating habits are very important. Choosing the right foods can help ease your discomfort. What guidelines do I need to follow?  Choose fruits, vegetables, whole grains, and low-fat dairy products.  Choose low-fat meat, fish, and poultry.  Limit fats such as oils, salad dressings, butter, nuts, and avocado.  Keep a food diary. This helps you identify foods that cause symptoms.  Avoid foods that cause symptoms. These may be different for everyone.  Eat small meals often instead of 3 large meals a day.  Eat your meals slowly, in a place where you are relaxed.  Limit fried foods.  Cook foods using methods other than frying.  Avoid drinking alcohol.  Avoid drinking large amounts of liquids with your meals.  Avoid bending over or lying down until 2-3 hours after eating. What foods are not recommended? These are some foods and drinks that may make your symptoms worse: Vegetables  Tomatoes. Tomato juice. Tomato and spaghetti sauce. Chili peppers. Onion and garlic. Horseradish. Fruits  Oranges, grapefruit, and lemon (fruit and juice). Meats  High-fat meats, fish, and poultry. This includes hot dogs, ribs, ham, sausage, salami, and bacon. Dairy  Whole milk and chocolate milk. Sour cream. Cream. Butter. Ice cream. Cream cheese. Drinks  Coffee and tea. Bubbly (carbonated) drinks or energy drinks. Condiments  Hot sauce. Barbecue sauce. Sweets/Desserts  Chocolate and cocoa. Donuts. Peppermint and spearmint. Fats and Oils  High-fat foods. This includes French fries and potato chips. Other  Vinegar. Strong spices. This includes black pepper, white pepper, red pepper, cayenne, curry powder, cloves, ginger, and chili powder. The items listed above may not be a complete list of foods and drinks to avoid. Contact your dietitian for more information.    This information is not intended to replace advice given to you by your health care provider. Make sure you discuss any questions you have with your health care provider. Document Released: 04/11/2012 Document Revised: 03/18/2016 Document Reviewed: 08/15/2013 Elsevier Interactive Patient Education  2017 Elsevier Inc.  

## 2018-07-13 NOTE — Assessment & Plan Note (Signed)
CBC normal Not on iron supplements Will monitor

## 2018-07-15 IMAGING — CR DG CHEST 2V
1 series · 2 of 2 positions shown · non-contrast
Comparison: None.

CLINICAL DATA: Shortness of breath.  Smoking history.

EXAM:
CHEST - 2 VIEW

[Series 1: dg chest 2 view · 0.14mm/px · 2 of 2 slices shown]
[im 1/2]
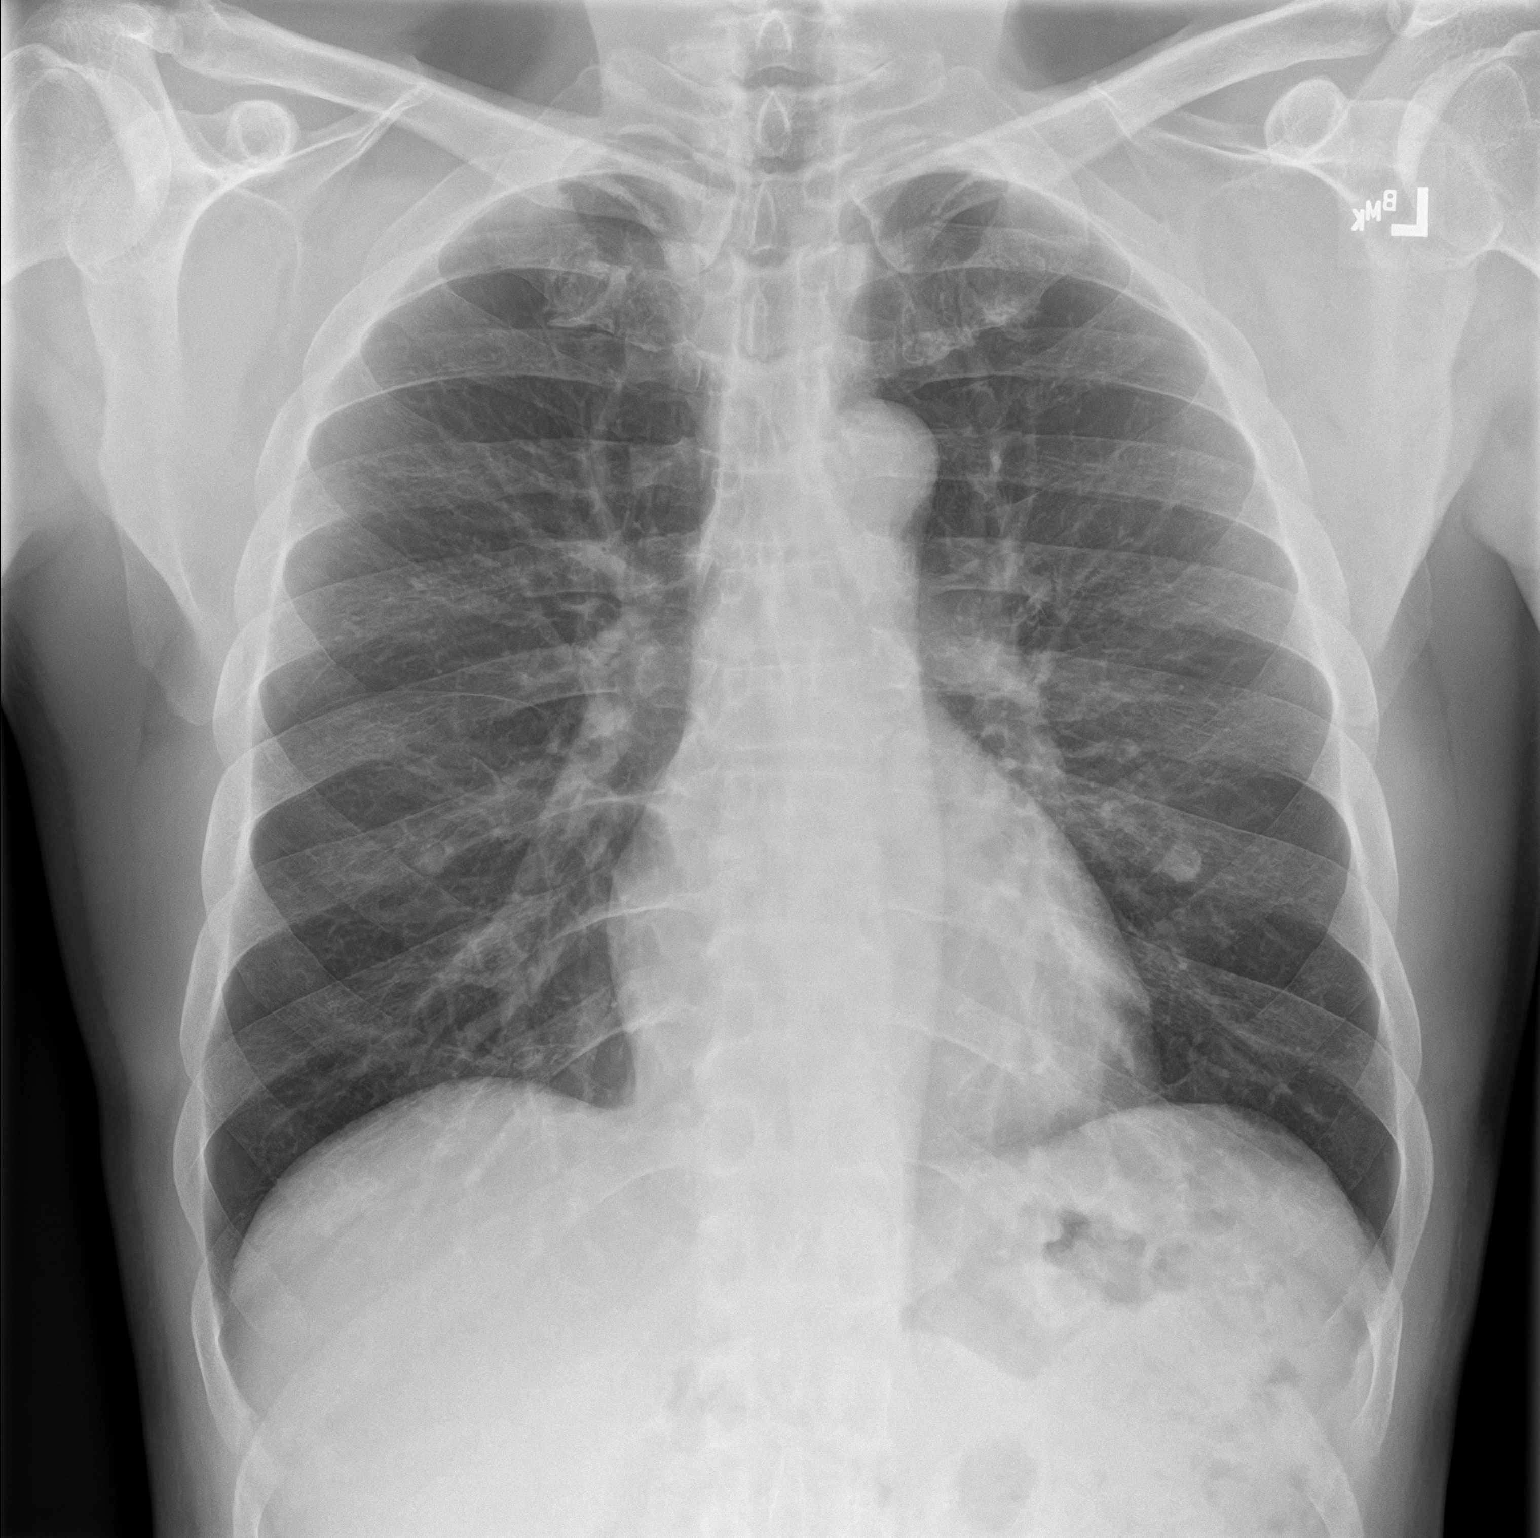
[im 2/2]
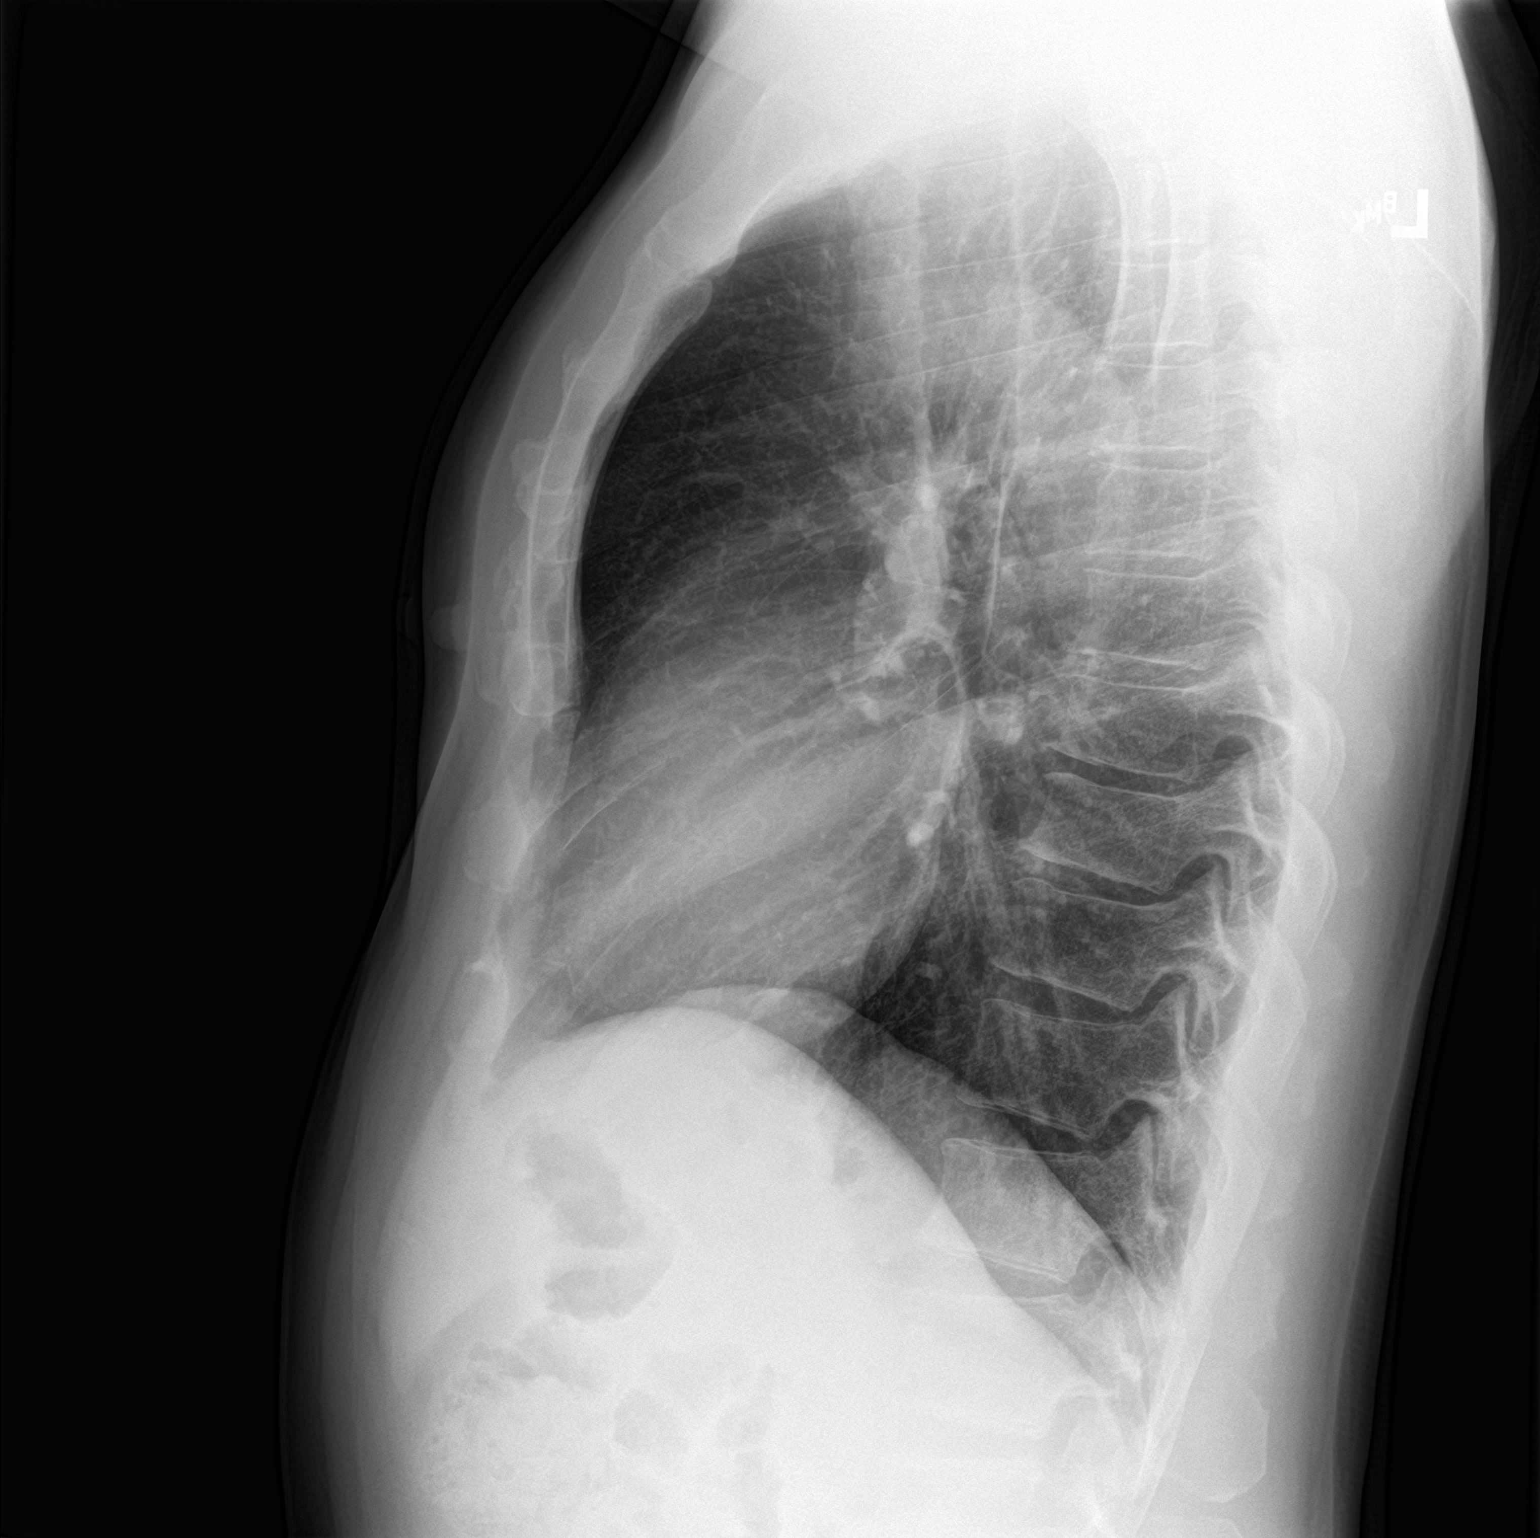

[2 of 2 positions shown; findings below may reference images not displayed]

FINDINGS: The cardiomediastinal contours are normal. Bilateral nipple shadows
noted. The lungs are clear. Pulmonary vasculature is normal. No
consolidation, pleural effusion, or pneumothorax. No acute osseous
abnormalities are seen.
IMPRESSION: No acute pulmonary process.

## 2018-08-03 ENCOUNTER — Other Ambulatory Visit: Payer: Self-pay | Admitting: Sports Medicine

## 2018-08-03 DIAGNOSIS — M5136 Other intervertebral disc degeneration, lumbar region: Secondary | ICD-10-CM

## 2018-08-23 ENCOUNTER — Ambulatory Visit
Admission: RE | Admit: 2018-08-23 | Discharge: 2018-08-23 | Disposition: A | Discharge status: Home / Self Care | Payer: No Typology Code available for payment source | Source: Ambulatory Visit | Attending: Sports Medicine | Admitting: Sports Medicine

## 2018-08-23 DIAGNOSIS — M50223 Other cervical disc displacement at C6-C7 level: Secondary | ICD-10-CM | POA: Insufficient documentation

## 2018-08-23 DIAGNOSIS — M5126 Other intervertebral disc displacement, lumbar region: Secondary | ICD-10-CM | POA: Insufficient documentation

## 2018-08-23 DIAGNOSIS — M4802 Spinal stenosis, cervical region: Secondary | ICD-10-CM | POA: Insufficient documentation

## 2018-08-23 DIAGNOSIS — M48061 Spinal stenosis, lumbar region without neurogenic claudication: Secondary | ICD-10-CM | POA: Insufficient documentation

## 2018-08-23 DIAGNOSIS — M5136 Other intervertebral disc degeneration, lumbar region: Secondary | ICD-10-CM

## 2018-08-29 ENCOUNTER — Other Ambulatory Visit: Payer: Self-pay | Admitting: Sports Medicine

## 2018-08-29 DIAGNOSIS — M5412 Radiculopathy, cervical region: Secondary | ICD-10-CM

## 2018-08-29 DIAGNOSIS — R200 Anesthesia of skin: Secondary | ICD-10-CM

## 2018-08-29 DIAGNOSIS — M5030 Other cervical disc degeneration, unspecified cervical region: Secondary | ICD-10-CM

## 2018-08-29 DIAGNOSIS — M4802 Spinal stenosis, cervical region: Secondary | ICD-10-CM

## 2018-09-04 ENCOUNTER — Ambulatory Visit
Admission: RE | Admit: 2018-09-04 | Discharge: 2018-09-04 | Disposition: A | Discharge status: Home / Self Care | Payer: No Typology Code available for payment source | Source: Ambulatory Visit | Attending: Sports Medicine | Admitting: Sports Medicine

## 2018-09-04 DIAGNOSIS — R937 Abnormal findings on diagnostic imaging of other parts of musculoskeletal system: Secondary | ICD-10-CM | POA: Insufficient documentation

## 2018-09-04 DIAGNOSIS — M50123 Cervical disc disorder at C6-C7 level with radiculopathy: Secondary | ICD-10-CM | POA: Insufficient documentation

## 2018-09-04 DIAGNOSIS — M5412 Radiculopathy, cervical region: Secondary | ICD-10-CM

## 2018-09-04 DIAGNOSIS — R200 Anesthesia of skin: Secondary | ICD-10-CM | POA: Diagnosis not present

## 2018-09-04 DIAGNOSIS — M5030 Other cervical disc degeneration, unspecified cervical region: Secondary | ICD-10-CM

## 2018-09-04 DIAGNOSIS — M4802 Spinal stenosis, cervical region: Secondary | ICD-10-CM

## 2018-09-12 ENCOUNTER — Ambulatory Visit (INDEPENDENT_AMBULATORY_CARE_PROVIDER_SITE_OTHER): Payer: No Typology Code available for payment source | Admitting: Internal Medicine

## 2018-09-12 ENCOUNTER — Encounter: Payer: Self-pay | Admitting: Internal Medicine

## 2018-09-12 VITALS — BP 128/84 | HR 65 | Temp 98.0°F | Ht 72.0 in | Wt 198.0 lb

## 2018-09-12 DIAGNOSIS — Z0000 Encounter for general adult medical examination without abnormal findings: Secondary | ICD-10-CM

## 2018-09-12 DIAGNOSIS — Z114 Encounter for screening for human immunodeficiency virus [HIV]: Secondary | ICD-10-CM

## 2018-09-12 DIAGNOSIS — Z125 Encounter for screening for malignant neoplasm of prostate: Secondary | ICD-10-CM

## 2018-09-12 DIAGNOSIS — Z1159 Encounter for screening for other viral diseases: Secondary | ICD-10-CM

## 2018-09-12 DIAGNOSIS — R202 Paresthesia of skin: Secondary | ICD-10-CM

## 2018-09-12 DIAGNOSIS — D1723 Benign lipomatous neoplasm of skin and subcutaneous tissue of right leg: Secondary | ICD-10-CM

## 2018-09-12 LAB — CBC
HCT: 48.7 % (ref 39.0–52.0)
HEMOGLOBIN: 16.2 g/dL (ref 13.0–17.0)
MCHC: 33.2 g/dL (ref 30.0–36.0)
MCV: 89.8 fl (ref 78.0–100.0)
PLATELETS: 253 10*3/uL (ref 150.0–400.0)
RBC: 5.42 Mil/uL (ref 4.22–5.81)
RDW: 13.4 % (ref 11.5–15.5)
WBC: 5.8 10*3/uL (ref 4.0–10.5)

## 2018-09-12 LAB — COMPREHENSIVE METABOLIC PANEL
ALBUMIN: 4.5 g/dL (ref 3.5–5.2)
ALK PHOS: 88 U/L (ref 39–117)
ALT: 27 U/L (ref 0–53)
AST: 20 U/L (ref 0–37)
BILIRUBIN TOTAL: 0.6 mg/dL (ref 0.2–1.2)
BUN: 16 mg/dL (ref 6–23)
CALCIUM: 9.6 mg/dL (ref 8.4–10.5)
CO2: 28 mEq/L (ref 19–32)
Chloride: 103 mEq/L (ref 96–112)
Creatinine, Ser: 1.13 mg/dL (ref 0.40–1.50)
GFR: 86.45 mL/min (ref >60.00)
Glucose, Bld: 102 mg/dL — ABNORMAL HIGH (ref 70–99)
Potassium: 4.4 mEq/L (ref 3.5–5.1)
Sodium: 138 mEq/L (ref 135–145)
TOTAL PROTEIN: 7.6 g/dL (ref 6.0–8.3)

## 2018-09-12 LAB — LIPID PANEL
CHOLESTEROL: 171 mg/dL (ref 0–200)
HDL: 33.9 mg/dL — AB (ref >39.00)
LDL Cholesterol: 122 mg/dL — ABNORMAL HIGH (ref 0–99)
NonHDL: 137.2
TRIGLYCERIDES: 75 mg/dL (ref 0.0–149.0)
Total CHOL/HDL Ratio: 5
VLDL: 15 mg/dL (ref 0.0–40.0)

## 2018-09-12 LAB — PSA: PSA: 0.75 ng/mL (ref 0.10–4.00)

## 2018-09-12 NOTE — Progress Notes (Signed)
Subjective:    Patient ID: Donald Austin, male    DOB: 1961/01/22, 57 y.o.   MRN: [id number]  HPI  Pt presents to the clinic today for his annual exam.   Flu: 06/2015 Tetanus: > 10 years ago Pneumovax: 06/2015 PSA Screening: 12/2010 Colon Screening: 10/2009 Vision Screening: annually Dentist: as needed  Diet: He does eat meat. He consumes fruits and veggies daily. He does eat some fried foods. He drinks mostly water, soda. Exercise: None    Review of Systems      Past Medical History:  Diagnosis Date  . Anemia    NOS  . GERD (gastroesophageal reflux disease)   . PVD (peripheral vascular disease) (HCC)     Current Outpatient Medications  Medication Sig Dispense Refill  . esomeprazole (NEXIUM) 40 MG capsule Take 40 mg by mouth as needed.     . tadalafil (CIALIS) 20 MG tablet Take 1 tablet by mouth as directed.     No current facility-administered medications for this visit.     No Known Allergies  Family History  Problem Relation Age of Onset  . Hypertension Father   . Kidney failure Father   . Hypertension Mother   . Cancer Sister        breast  . Hypertension Sister   . Hypertension Sister   . Hypertension Brother   . Hypertension Brother   . Heart attack Neg Hx        <55    Social History   Socioeconomic History  . Marital status: Married    Spouse name: Not on file  . Number of children: 3  . Years of education: Not on file  . Highest education level: Not on file  Occupational History  . Occupation: Ecologist    Comment: [place]  . Financial resource strain: Not on file  . Food insecurity:    Worry: Not on file    Inability: Not on file  . Transportation needs:    Medical: Not on file    Non-medical: Not on file  Tobacco Use  . Smoking status: Current Every Day Smoker    Packs/day: 1.00    Types: Cigarettes  . Smokeless tobacco: Never Used  . Tobacco comment: quit 4 days ago, 20pyh  Substance and Sexual  Activity  . Alcohol use: Yes    Alcohol/week: 6.0 standard drinks    Types: 6 Standard drinks or equivalent per week    Comment: occasional-daily  . Drug use: Not Currently    Types: Marijuana  . Sexual activity: Not on file  Lifestyle  . Physical activity:    Days per week: Not on file    Minutes per session: Not on file  . Stress: Not on file  Relationships  . Social connections:    Talks on phone: Not on file    Gets together: Not on file    Attends religious service: Not on file    Active member of club or organization: Not on file    Attends meetings of clubs or organizations: Not on file    Relationship status: Not on file  . Intimate partner violence:    Fear of current or ex partner: Not on file    Emotionally abused: Not on file    Physically abused: Not on file    Forced sexual activity: Not on file  Other Topics Concern  . Not on file  Social History Narrative   Regular exercise--no  Children 3 healthy   Diet: Avoids fried food, baked, occ. Fruits and veggies     Constitutional: Denies fever, malaise, fatigue, headache or abrupt weight changes.  HEENT: Denies eye pain, eye redness, ear pain, ringing in the ears, wax buildup, runny nose, nasal congestion, bloody nose, or sore throat. Respiratory: Denies difficulty breathing, shortness of breath, cough or sputum production.   Cardiovascular: Denies chest pain, chest tightness, palpitations or swelling in the hands or feet.  Gastrointestinal: Pt reports intermittent reflux. Denies abdominal pain, bloating, constipation, diarrhea or blood in the stool.  GU: Denies urgency, frequency, pain with urination, burning sensation, blood in urine, odor or discharge. Musculoskeletal: Denies decrease in range of motion, difficulty with gait, muscle pain or joint pain and swelling.  Skin: Pt reports mass above right knee. Denies redness, rashes, or ulcercations.  Neurological: Pt reports numbness of right arm, left leg  (following with neurology). Denies dizziness, difficulty with memory, difficulty with speech or problems with balance and coordination.  Psych: Denies anxiety, depression, SI/HI.  No other specific complaints in a complete review of systems (except as listed in HPI above).  Objective:   Physical Exam   BP 128/84   Pulse 65   Temp 98 F (36.7 C) (Oral)   Ht 6' (1.829 m)   Wt 198 lb (89.8 kg)   SpO2 98%   BMI 26.85 kg/m  Wt Readings from Last 3 Encounters:  09/12/18 198 lb (89.8 kg)  07/13/18 196 lb (88.9 kg)  05/01/18 180 lb (81.6 kg)    General: Appears his stated age, well developed, well nourished in NAD. Skin: Warm, dry and intact. 2.5 cm lipoma noted above right knee. HEENT: Head: normal shape and size; Eyes: sclera white, no icterus, conjunctiva pink, PERRLA and EOMs intact; Ears: Tm's gray and intact, normal light reflex;  Throat/Mouth: Teeth present, mucosa pink and moist, no exudate, lesions or ulcerations noted.  Neck:  Neck supple, trachea midline. No masses, lumps or thyromegaly present.  Cardiovascular: Normal rate and rhythm. S1,S2 noted.  No murmur, rubs or gallops noted. No JVD or BLE edema. No carotid bruits noted. Pulmonary/Chest: Normal effort and positive vesicular breath sounds. No respiratory distress. No wheezes, rales or ronchi noted.  Abdomen: Soft and nontender. Normal bowel sounds. No distention or masses noted. Liver, spleen and kidneys non palpable. Musculoskeletal: Strength 5/5 BUE/BLE. No difficulty with gait.  Neurological: Alert and oriented. Coordination normal.  Psychiatric: Mood and affect normal. Behavior is normal. Judgment and thought content normal.     BMET    Component Value Date/Time   NA 139 05/01/2018 2221   K 4.1 05/01/2018 2221   CL 105 05/01/2018 2221   CO2 29 05/01/2018 2221   GLUCOSE 92 05/01/2018 2221   BUN 17 05/01/2018 2221   CREATININE 1.06 05/01/2018 2221   CALCIUM 8.9 05/01/2018 2221   GFRNONAA >60 05/01/2018 2221     GFRAA >60 05/01/2018 2221    Lipid Panel     Component Value Date/Time   CHOL 125 12/23/2006 0922   TRIG 42 12/23/2006 0922   HDL 38.3 (L) 12/23/2006 0922   CHOLHDL 3.3 CALC 12/23/2006 0922   VLDL 8 12/23/2006 0922   LDLCALC 78 12/23/2006 0922    CBC    Component Value Date/Time   WBC 7.5 05/01/2018 2221   RBC 5.05 05/01/2018 2221   HGB 15.1 05/01/2018 2221   HCT 45.2 05/01/2018 2221   PLT 212 05/01/2018 2221   MCV 89.6 05/01/2018 2221  MCH 30.0 05/01/2018 2221   MCHC 33.5 05/01/2018 2221   RDW 13.7 05/01/2018 2221   LYMPHSABS 2.5 12/25/2010 1643   MONOABS 0.7 12/25/2010 1643   EOSABS 0.1 12/25/2010 1643   BASOSABS 0.0 12/25/2010 1643    Hgb A1C No results found for: HGBA1C         Assessment & Plan:   Preventative Health Maintenance:  He declines flu or tetanus vaccine today Pneumovax UTD Colon screening UTD Encouraged him to consume a balanced diet and exercise regimen Advised him to see an eye doctor and dentist annually Will check CBC, CMET, Lipid, PSA, HIV and Hep C today  Lipoma, Right Thigh:  No intervention needed Will monitor  Paresthesia of RUE, LLE:  Will continue to follow with neurology at this time  RTC in 1 year, sooner if needed Webb Silversmith, NP

## 2018-09-12 NOTE — Patient Instructions (Signed)

## 2018-09-13 LAB — HIV ANTIBODY (ROUTINE TESTING W REFLEX): HIV: NONREACTIVE

## 2018-09-13 LAB — HEPATITIS C ANTIBODY
Hepatitis C Ab: NONREACTIVE
SIGNAL TO CUT-OFF: 0.01 (ref <1.00)

## 2018-11-17 IMAGING — CT CT CERVICAL SPINE W/O CM
4 of 5 series · 14 of 33 positions shown, 16 images · non-contrast
Comparison: Cervical spine MRI 08/23/2018.

CLINICAL DATA: 57-year-old male with right arm numbness since
[place]. Further evaluation of indeterminate C5 vertebral body marrow
signal on MRI last month.

EXAM:
CT CERVICAL SPINE WITHOUT CONTRAST
TECHNIQUE: Multidetector CT imaging of the cervical spine was performed without
intravenous contrast. Multiplanar CT image reconstructions were also
generated.

[Series 2: axial bone c-spine · axial · 0.41mm/px · z∈[-756,-640]mm · 3 of 116 slices shown, 4 images]
[im 29/116  soft-tissue]
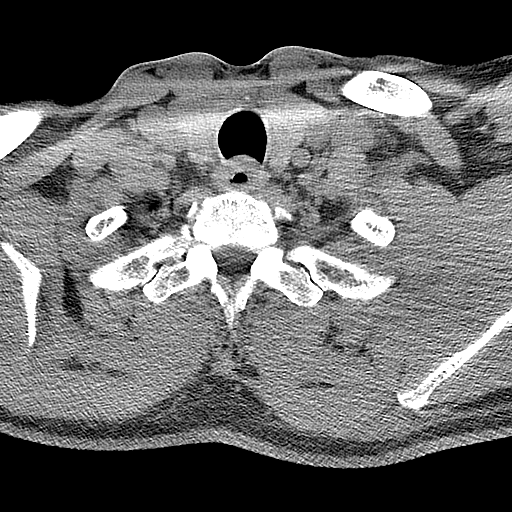
[im 29/116  bone]
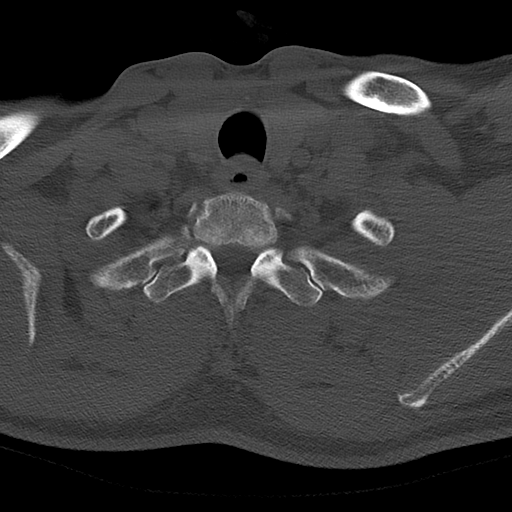
[im 58/116  bone]
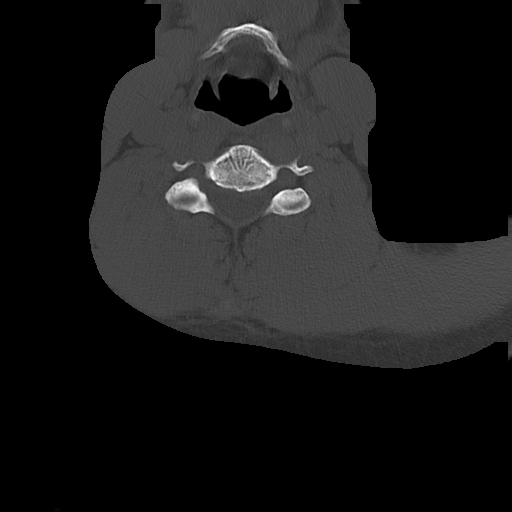
[im 87/116  bone]
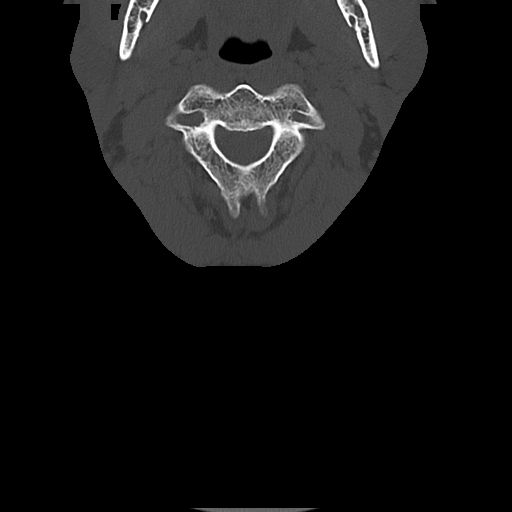

[Series 4: sag bone c-spine · sagittal · 0.39mm/px · 5 of 89 slices shown, 6 images]
[im 30/89  bone]
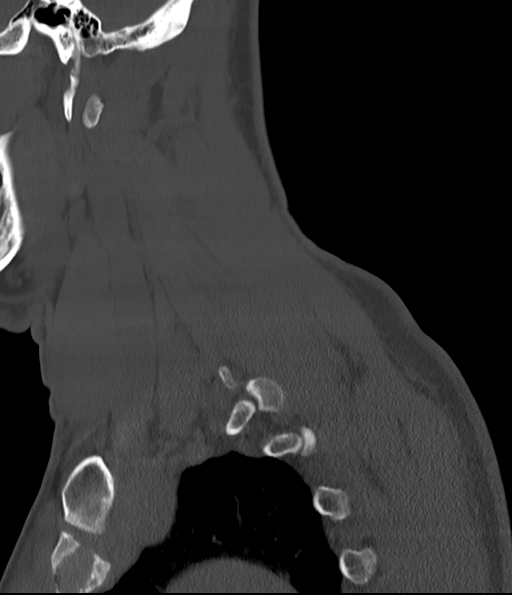
[im 37/89  bone]
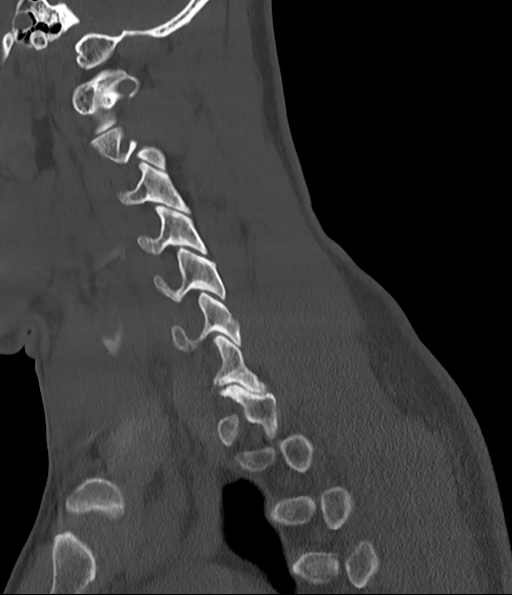
[im 45/89  soft-tissue]
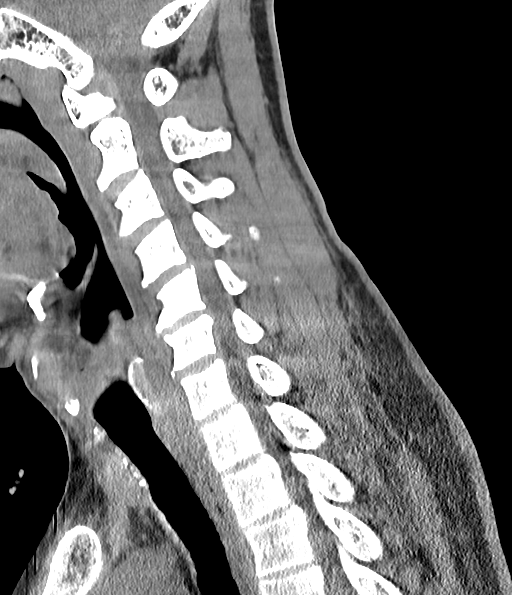
[im 45/89  bone]
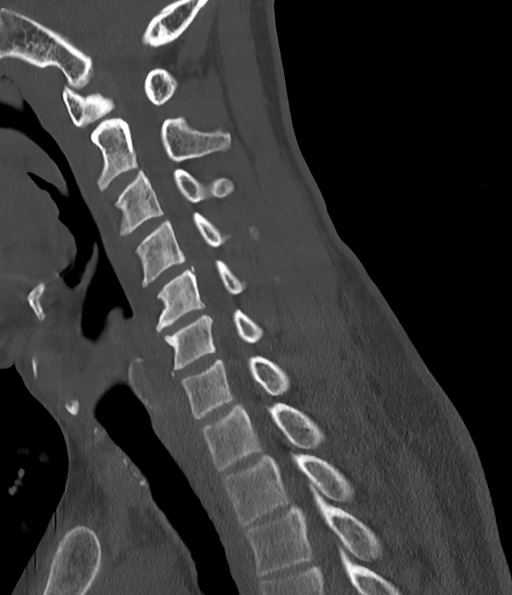
[im 52/89  bone]
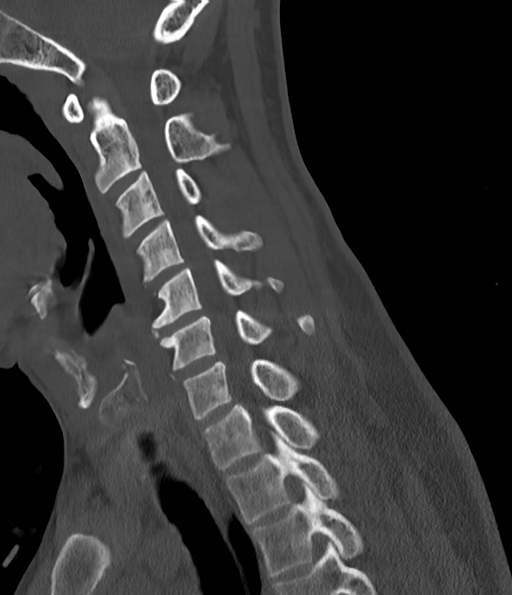
[im 59/89  bone]
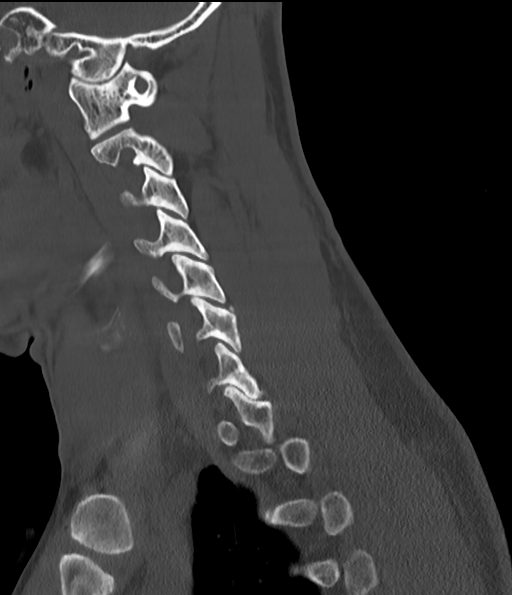

[Series 6: cor bone c-spine · coronal · 0.35mm/px · 3 of 100 slices shown]
[im 34/100  bone]
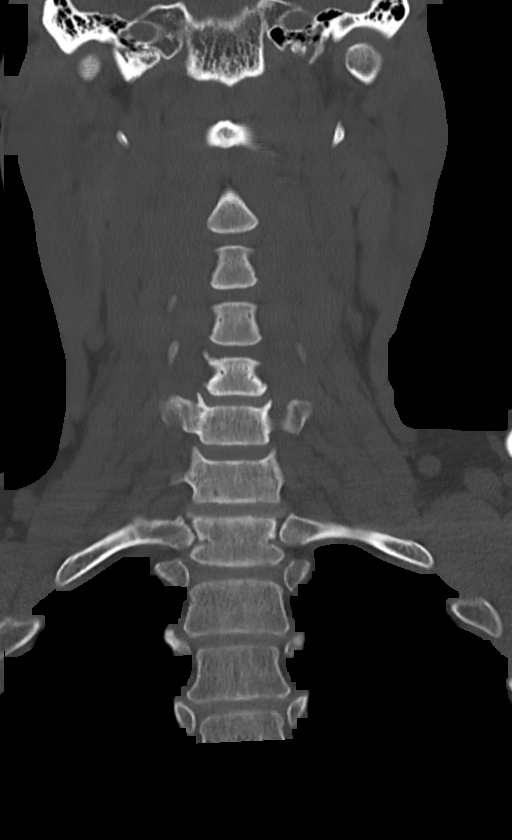
[im 45/100  bone]
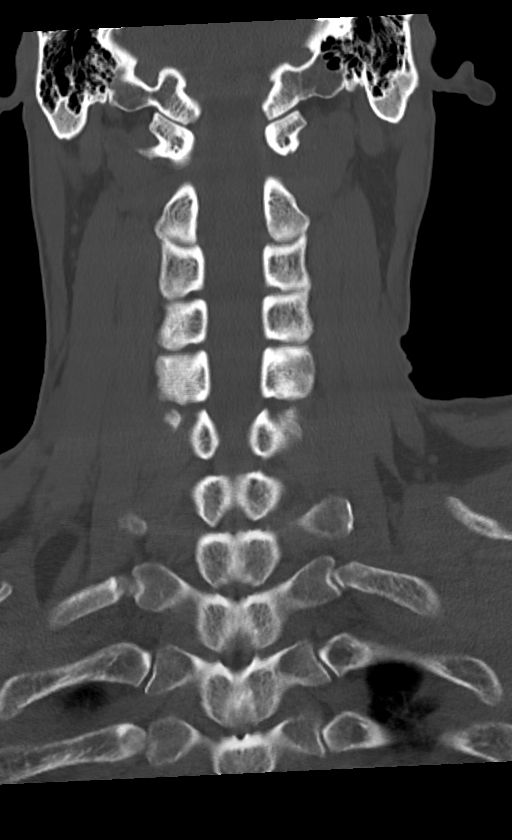
[im 55/100  bone]
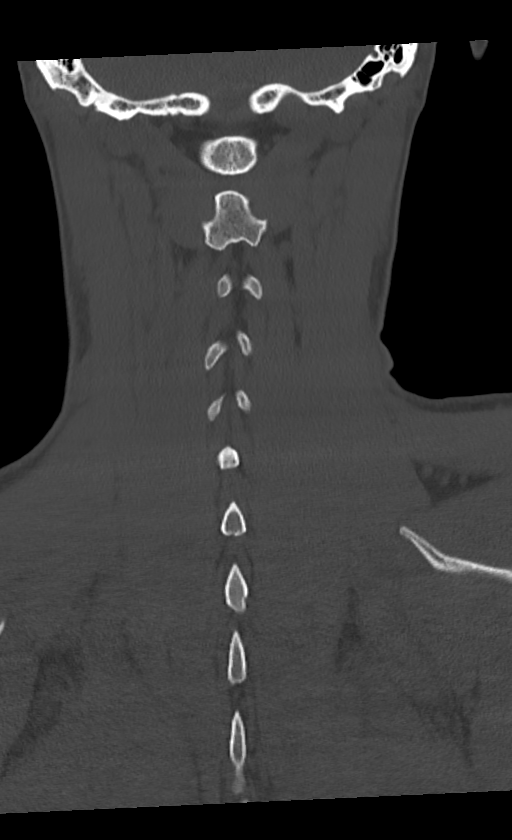

[Series 8: orthogonal axial c-spine · axial · 0.35mm/px · z∈[-786,-661]mm · 3 of 133 slices shown]
[im 34/133  bone]
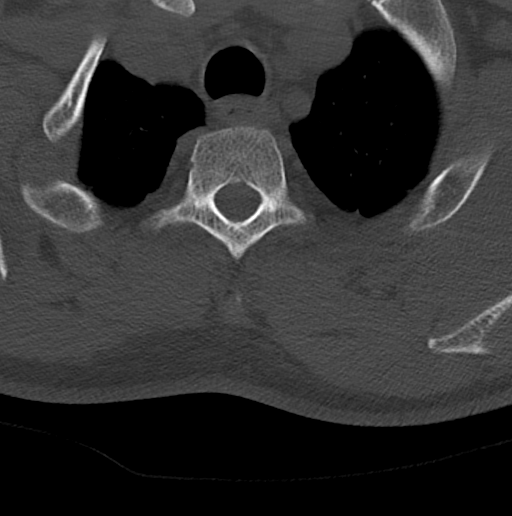
[im 67/133  bone]
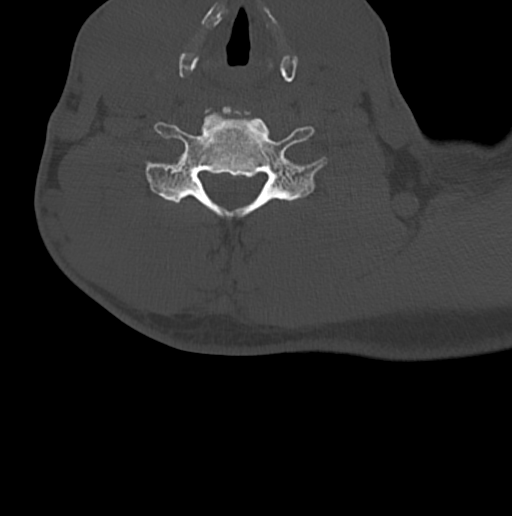
[im 100/133  bone]
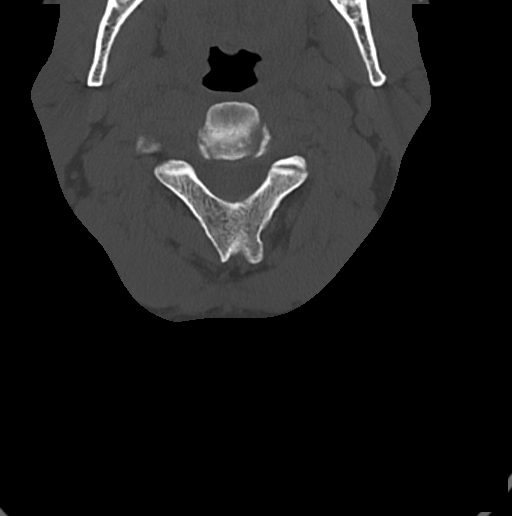

[14 of 33 positions shown; findings below may reference images not displayed]

FINDINGS: Alignment: Mildly increase straightening of cervical lordosis.
Cervicothoracic junction and bilateral posterior element alignment
remains normal.

Skull base and vertebrae: Intact skull base. No cervical spine
fracture or No acute osseous abnormality identified.

C5 bone mineralization is within normal limits and similar to that
elsewhere in the visible spine. No C5 bone lesion or suspicious
finding.

Soft tissues and spinal canal: Negative noncontrast neck soft
tissues.

Disc levels: Disc levels were better evaluated on the recent
cervical spine MRI. The C6-C7 disc herniation persists and is
evident on series 3, image 68.

Upper chest: Visible upper thoracic levels appear intact and within
normal limits.

Negative lung apices. Negative noncontrast thoracic inlet.

Other: Negative visible posterior fossa. Bilateral tympanic cavities
and mastoids are clear.
IMPRESSION: 1. No CT abnormality to correspond to the mildly abnormal marrow
signal in the C5 vertebral body last month. This strongly favors
benign etiology and significance is doubtful. Repeat noncontrast
cervical spine MRI could be used to document stability (e.g. in 6
months).
2. Persistent C6-C7 disc herniation, better characterized on the
comparison MRI.

## 2018-11-28 ENCOUNTER — Ambulatory Visit (INDEPENDENT_AMBULATORY_CARE_PROVIDER_SITE_OTHER): Payer: No Typology Code available for payment source | Admitting: Internal Medicine

## 2018-11-28 VITALS — BP 130/84 | HR 77 | Temp 97.9°F | Wt 200.0 lb

## 2018-11-28 DIAGNOSIS — R350 Frequency of micturition: Secondary | ICD-10-CM

## 2018-11-28 DIAGNOSIS — R3989 Other symptoms and signs involving the genitourinary system: Secondary | ICD-10-CM | POA: Diagnosis not present

## 2018-11-28 DIAGNOSIS — R3129 Other microscopic hematuria: Secondary | ICD-10-CM

## 2018-11-28 LAB — CBC
HCT: 49.6 % (ref 39.0–52.0)
Hemoglobin: 16.8 g/dL (ref 13.0–17.0)
MCHC: 33.8 g/dL (ref 30.0–36.0)
MCV: 88.8 fl (ref 78.0–100.0)
PLATELETS: 247 10*3/uL (ref 150.0–400.0)
RBC: 5.59 Mil/uL (ref 4.22–5.81)
RDW: 13 % (ref 11.5–15.5)
WBC: 8 10*3/uL (ref 4.0–10.5)

## 2018-11-28 LAB — COMPREHENSIVE METABOLIC PANEL
ALBUMIN: 4.5 g/dL (ref 3.5–5.2)
ALT: 41 U/L (ref 0–53)
AST: 31 U/L (ref 0–37)
Alkaline Phosphatase: 94 U/L (ref 39–117)
BUN: 17 mg/dL (ref 6–23)
CHLORIDE: 103 meq/L (ref 96–112)
CO2: 26 meq/L (ref 19–32)
CREATININE: 1.34 mg/dL (ref 0.40–1.50)
Calcium: 9.7 mg/dL (ref 8.4–10.5)
GFR: 66.76 mL/min (ref >60.00)
Glucose, Bld: 98 mg/dL (ref 70–99)
Potassium: 4.1 mEq/L (ref 3.5–5.1)
SODIUM: 136 meq/L (ref 135–145)
Total Bilirubin: 0.5 mg/dL (ref 0.2–1.2)
Total Protein: 7.9 g/dL (ref 6.0–8.3)

## 2018-11-28 LAB — POC URINALSYSI DIPSTICK (AUTOMATED)
Bilirubin, UA: NEGATIVE
GLUCOSE UA: NEGATIVE
Ketones, UA: NEGATIVE
Leukocytes, UA: NEGATIVE
Nitrite, UA: NEGATIVE
Protein, UA: NEGATIVE
SPEC GRAV UA: 1.025 (ref 1.010–1.025)
Urobilinogen, UA: 0.2 E.U./dL
pH, UA: 6 (ref 5.0–8.0)

## 2018-11-28 NOTE — Progress Notes (Signed)
Subjective:    Patient ID: Donald Austin, male    DOB: Aug 20, 1961, 58 y.o.   MRN: [id number]  HPI  Patient presents to the clinic today with complaint of bladder pressure and urinary frequency.  He reports this started 1 to 2 weeks ago.  He reports that the abdominal pain does not radiate.  He denies urgency, dysuria, penile discharge or testicular pain and swelling.  He denies any masses or lumps in the groin area.  He denies nausea, vomiting or back pain.  He is having normal bowel movements.  He denies blood in his urine or stool.  He has no history of kidney stones.  He has no concern about STD.  He does smoke.  He is unsure if there is a family history of prostate or bladder cancer.  He has not tried anything over-the-counter for his symptoms.  Review of Systems  Past Medical History:  Diagnosis Date  . Anemia    NOS  . GERD (gastroesophageal reflux disease)   . PVD (peripheral vascular disease) (HCC)     Current Outpatient Medications  Medication Sig Dispense Refill  . esomeprazole (NEXIUM) 40 MG capsule Take 40 mg by mouth as needed.     . tadalafil (CIALIS) 20 MG tablet Take 1 tablet by mouth as directed.     No current facility-administered medications for this visit.     No Known Allergies  Family History  Problem Relation Age of Onset  . Hypertension Father   . Kidney failure Father   . Hypertension Mother   . Cancer Sister        breast  . Hypertension Sister   . Hypertension Sister   . Hypertension Brother   . Hypertension Brother   . Heart attack Neg Hx        <55    Social History   Socioeconomic History  . Marital status: Married    Spouse name: Not on file  . Number of children: 3  . Years of education: Not on file  . Highest education level: Not on file  Occupational History  . Occupation: Ecologist    Comment: [employer]  . Financial resource strain: Not on file  . Food insecurity:    Worry: Not on file   Inability: Not on file  . Transportation needs:    Medical: Not on file    Non-medical: Not on file  Tobacco Use  . Smoking status: Current Every Day Smoker    Packs/day: 1.00    Types: Cigarettes  . Smokeless tobacco: Never Used  . Tobacco comment: quit 4 days ago, 20pyh  Substance and Sexual Activity  . Alcohol use: Yes    Alcohol/week: 6.0 standard drinks    Types: 6 Standard drinks or equivalent per week    Comment: occasional-daily  . Drug use: Not Currently    Types: Marijuana  . Sexual activity: Not on file  Lifestyle  . Physical activity:    Days per week: Not on file    Minutes per session: Not on file  . Stress: Not on file  Relationships  . Social connections:    Talks on phone: Not on file    Gets together: Not on file    Attends religious service: Not on file    Active member of club or organization: Not on file    Attends meetings of clubs or organizations: Not on file    Relationship status: Not on file  .  Intimate partner violence:    Fear of current or ex partner: Not on file    Emotionally abused: Not on file    Physically abused: Not on file    Forced sexual activity: Not on file  Other Topics Concern  . Not on file  Social History Narrative   Regular exercise--no   Children 3 healthy   Diet: Avoids fried food, baked, occ. Fruits and veggies     Constitutional: Denies fever, malaise, fatigue, headache or abrupt weight changes.  Respiratory: Denies difficulty breathing, shortness of breath, cough or sputum production.   Cardiovascular: Denies chest pain, chest tightness, palpitations or swelling in the hands or feet.  Gastrointestinal: Denies abdominal pain, bloating, constipation, diarrhea or blood in the stool.  GU: Patient reports bladder pressure, urinary frequency.  Denies urgency, pain with urination, burning sensation, blood in urine, odor or discharge. Musculoskeletal: Denies decrease in range of motion, difficulty with gait, muscle pain or  joint pain and swelling.   No other specific complaints in a complete review of systems (except as listed in HPI above).     Objective:   Physical Exam  BP 130/84   Pulse 77   Temp 97.9 F (36.6 C) (Oral)   Wt 200 lb (90.7 kg)   SpO2 97%   BMI 27.12 kg/m   Wt Readings from Last 3 Encounters:  09/12/18 198 lb (89.8 kg)  07/13/18 196 lb (88.9 kg)  05/01/18 180 lb (81.6 kg)    General: Appears his stated age, well developed, well nourished in NAD. Skin: Warm, dry and intact. No rashes noted. Cardiovascular: Normal rate and rhythm. S1,S2 noted.  No murmur, rubs or gallops noted.  Pulmonary/Chest: Normal effort and positive vesicular breath sounds. No respiratory distress. No wheezes, rales or ronchi noted.  Abdomen: Soft and nontender. Normal bowel sounds. No distention or masses noted. GU: Bilateral indirect inguinal hernia noted. Rectal: Normal rectal tone.  Prostate not enlarged or boggy. Neurological: Alert and oriented.   Psychiatric: Mildly anxious appearing.  BMET    Component Value Date/Time   NA 138 09/12/2018 1004   K 4.4 09/12/2018 1004   CL 103 09/12/2018 1004   CO2 28 09/12/2018 1004   GLUCOSE 102 (H) 09/12/2018 1004   BUN 16 09/12/2018 1004   CREATININE 1.13 09/12/2018 1004   CALCIUM 9.6 09/12/2018 1004   GFRNONAA >60 05/01/2018 2221   GFRAA >60 05/01/2018 2221    Lipid Panel     Component Value Date/Time   CHOL 171 09/12/2018 1004   TRIG 75.0 09/12/2018 1004   HDL 33.90 (L) 09/12/2018 1004   CHOLHDL 5 09/12/2018 1004   VLDL 15.0 09/12/2018 1004   LDLCALC 122 (H) 09/12/2018 1004    CBC    Component Value Date/Time   WBC 5.8 09/12/2018 1004   RBC 5.42 09/12/2018 1004   HGB 16.2 09/12/2018 1004   HCT 48.7 09/12/2018 1004   PLT 253.0 09/12/2018 1004   MCV 89.8 09/12/2018 1004   MCH 30.0 05/01/2018 2221   MCHC 33.2 09/12/2018 1004   RDW 13.4 09/12/2018 1004   LYMPHSABS 2.5 12/25/2010 1643   MONOABS 0.7 12/25/2010 1643   EOSABS 0.1  12/25/2010 1643   BASOSABS 0.0 12/25/2010 1643    Hgb A1C No results found for: HGBA1C          Assessment & Plan:   Bladder pressure, urinary frequency, microscopic hematuria:  Urinalysis: Trace blood We will send urine culture No evidence of prostatitis Inguinal hernias likely chronic,  not acute CBC and C met today Consider CT renal stone study if worsens  We will follow-up after labs and culture back.  ER precautions discussed Webb Silversmith, NP

## 2018-11-29 ENCOUNTER — Encounter: Payer: Self-pay | Admitting: Internal Medicine

## 2018-11-29 LAB — URINE CULTURE
MICRO NUMBER:: 148218
SPECIMEN QUALITY: ADEQUATE

## 2018-11-29 NOTE — Patient Instructions (Signed)
Hematuria, Adult  Hematuria is blood in the urine. Blood may be visible in the urine, or it may be identified with a test. This condition can be caused by infections of the bladder, urethra, kidney, or prostate. Other possible causes include:   Kidney stones.   Cancer of the urinary tract.   Too much calcium in the urine.   Conditions that are passed from parent to child (inherited conditions).   Exercise that requires a lot of energy.  Infections can usually be treated with medicine, and a kidney stone usually will pass through your urine. If neither of these is the cause of your hematuria, more tests may be needed to identify the cause of your symptoms.  It is very important to tell your health care provider about any blood in your urine, even if it is painless or the blood stops without treatment. Blood in the urine, when it happens and then stops and then happens again, can be a symptom of a very serious condition, including cancer. There is no pain in the initial stages of many urinary cancers.  Follow these instructions at home:  Medicines   Take over-the-counter and prescription medicines only as told by your health care provider.   If you were prescribed an antibiotic medicine, take it as told by your health care provider. Do not stop taking the antibiotic even if you start to feel better.  Eating and drinking   Drink enough fluid to keep your urine clear or pale yellow. It is recommended that you drink 3-4 quarts (2.8-3.8 L) a day. If you have been diagnosed with an infection, it is recommended that you drink cranberry juice in addition to large amounts of water.   Avoid caffeine, tea, and carbonated beverages. These tend to irritate the bladder.   Avoid alcohol because it may irritate the prostate (men).  General instructions   If you have been diagnosed with a kidney stone, follow your health care provider's instructions about straining your urine to catch the stone.   Empty your bladder  often. Avoid holding urine for long periods of time.   If you are male:  ? After a bowel movement, wipe from front to back and use each piece of toilet paper only once.  ? Empty your bladder before and after sex.   Pay attention to any changes in your symptoms. Tell your health care provider about any changes or any new symptoms.   It is your responsibility to get your test results. Ask your health care provider, or the department performing the test, when your results will be ready.   Keep all follow-up visits as told by your health care provider. This is important.  Contact a health care provider if:   You develop back pain.   You have a fever.   You have nausea or vomiting.   Your symptoms do not improve after 3 days.   Your symptoms get worse.  Get help right away if:   You develop severe vomiting and are unable take medicine without vomiting.   You develop severe pain in your back or abdomen even though you are taking medicine.   You pass a large amount of blood in your urine.   You pass blood clots in your urine.   You feel very weak or like you might faint.   You faint.  Summary   Hematuria is blood in the urine. It has many possible causes.   It is very important that you tell   your health care provider about any blood in your urine, even if it is painless or the blood stops without treatment.   Take over-the-counter and prescription medicines only as told by your health care provider.   Drink enough fluid to keep your urine clear or pale yellow.  This information is not intended to replace advice given to you by your health care provider. Make sure you discuss any questions you have with your health care provider.  Document Released: 10/11/2005 Document Revised: 11/13/2016 Document Reviewed: 11/13/2016  Elsevier Interactive Patient Education  2019 [employer]vier Inc[employer]

## 2019-02-28 ENCOUNTER — Encounter: Payer: Self-pay | Admitting: Internal Medicine

## 2019-02-28 ENCOUNTER — Ambulatory Visit (INDEPENDENT_AMBULATORY_CARE_PROVIDER_SITE_OTHER): Payer: No Typology Code available for payment source | Admitting: Internal Medicine

## 2019-02-28 VITALS — Wt 203.0 lb

## 2019-02-28 DIAGNOSIS — R110 Nausea: Secondary | ICD-10-CM

## 2019-02-28 DIAGNOSIS — R1013 Epigastric pain: Secondary | ICD-10-CM | POA: Diagnosis not present

## 2019-02-28 DIAGNOSIS — R140 Abdominal distension (gaseous): Secondary | ICD-10-CM

## 2019-02-28 DIAGNOSIS — K219 Gastro-esophageal reflux disease without esophagitis: Secondary | ICD-10-CM | POA: Diagnosis not present

## 2019-02-28 NOTE — Progress Notes (Signed)
Virtual Visit via Video Note  I connected with Donald Austin on 02/28/19 at  3:00 PM EDT by a video enabled telemedicine application and verified that I am speaking with the correct person using two identifiers.  Location: Patient: Home Provider: Office   I discussed the limitations of evaluation and management by telemedicine and the availability of in person appointments. The patient expressed understanding and agreed to proceed.  History of Present Illness:  Pt reports abdominal pain and reflux. He reports this started years ago but seems to be getting worse in the last few months. He reports feeling like food is coming up in his throat. He has associated epigastric pain, nausea without vomiting, bloating and belching. His has some mild constipation, but denies blood in his stool. He is taking Esomeprazole 40 mg daily, but reports it is not providing much relief. He denies recent change in diet. He is under a little of stress, especially trying to live during this pandemic.   Past Medical History:  Diagnosis Date  . Anemia    NOS  . GERD (gastroesophageal reflux disease)   . PVD (peripheral vascular disease) (HCC)     Current Outpatient Medications  Medication Sig Dispense Refill  . esomeprazole (NEXIUM) 40 MG capsule Take 40 mg by mouth as needed.     . tadalafil (CIALIS) 20 MG tablet Take 1 tablet by mouth as directed.     No current facility-administered medications for this visit.     No Known Allergies  Family History  Problem Relation Age of Onset  . Hypertension Father   . Kidney failure Father   . Hypertension Mother   . Cancer Sister        breast  . Hypertension Sister   . Hypertension Sister   . Hypertension Brother   . Hypertension Brother   . Heart attack Neg Hx        <55    Social History   Socioeconomic History  . Marital status: Married    Spouse name: Not on file  . Number of children: 3  . Years of education: Not on file  . Highest  education level: Not on file  Occupational History  . Occupation: Ecologist    Comment: [place]  . Financial resource strain: Not on file  . Food insecurity:    Worry: Not on file    Inability: Not on file  . Transportation needs:    Medical: Not on file    Non-medical: Not on file  Tobacco Use  . Smoking status: Current Every Day Smoker    Packs/day: 1.00    Types: Cigarettes  . Smokeless tobacco: Never Used  . Tobacco comment: quit 4 days ago, 20pyh  Substance and Sexual Activity  . Alcohol use: Yes    Alcohol/week: 6.0 standard drinks    Types: 6 Standard drinks or equivalent per week    Comment: occasional-daily  . Drug use: Not Currently    Types: Marijuana  . Sexual activity: Not on file  Lifestyle  . Physical activity:    Days per week: Not on file    Minutes per session: Not on file  . Stress: Not on file  Relationships  . Social connections:    Talks on phone: Not on file    Gets together: Not on file    Attends religious service: Not on file    Active member of club or organization: Not on file    Attends meetings  of clubs or organizations: Not on file    Relationship status: Not on file  . Intimate partner violence:    Fear of current or ex partner: Not on file    Emotionally abused: Not on file    Physically abused: Not on file    Forced sexual activity: Not on file  Other Topics Concern  . Not on file  Social History Narrative   Regular exercise--no   Children 3 healthy   Diet: Avoids fried food, baked, occ. Fruits and veggies     Constitutional: Denies fever, malaise, fatigue, headache or abrupt weight changes.  Respiratory: Denies difficulty breathing, shortness of breath, cough or sputum production.   Cardiovascular: Denies chest pain, chest tightness, palpitations or swelling in the hands or feet.  Gastrointestinal: Pt reports abdominal pain, reflux, nausea. Denies diarrhea or blood in the stool.  Neurological: Denies  dizziness, difficulty with memory, difficulty with speech or problems with balance and coordination.  Psych: Denies anxiety, depression, SI/HI.  No other specific complaints in a complete review of systems (except as listed in HPI above).   Wt Readings from Last 3 Encounters:  11/28/18 200 lb (90.7 kg)  09/12/18 198 lb (89.8 kg)  07/13/18 196 lb (88.9 kg)    General: Appears his stated age, well developed, well nourished in NAD. Skin: Warm, dry and intact. HEENT: Head: normal shape and size; Pulmonary/Chest: Normal effort . No respiratory distress.  Neurological: Alert and oriented. Psychiatric: Mood and affect normal. Behavior is normal. Judgment and thought content normal.    BMET    Component Value Date/Time   NA 136 11/28/2018 1443   K 4.1 11/28/2018 1443   CL 103 11/28/2018 1443   CO2 26 11/28/2018 1443   GLUCOSE 98 11/28/2018 1443   BUN 17 11/28/2018 1443   CREATININE 1.34 11/28/2018 1443   CALCIUM 9.7 11/28/2018 1443   GFRNONAA >60 05/01/2018 2221   GFRAA >60 05/01/2018 2221    Lipid Panel     Component Value Date/Time   CHOL 171 09/12/2018 1004   TRIG 75.0 09/12/2018 1004   HDL 33.90 (L) 09/12/2018 1004   CHOLHDL 5 09/12/2018 1004   VLDL 15.0 09/12/2018 1004   LDLCALC 122 (H) 09/12/2018 1004    CBC    Component Value Date/Time   WBC 8.0 11/28/2018 1443   RBC 5.59 11/28/2018 1443   HGB 16.8 11/28/2018 1443   HCT 49.6 11/28/2018 1443   PLT 247.0 11/28/2018 1443   MCV 88.8 11/28/2018 1443   MCH 30.0 05/01/2018 2221   MCHC 33.8 11/28/2018 1443   RDW 13.0 11/28/2018 1443   LYMPHSABS 2.5 12/25/2010 1643   MONOABS 0.7 12/25/2010 1643   EOSABS 0.1 12/25/2010 1643   BASOSABS 0.0 12/25/2010 1643    Hgb A1C No results found for: HGBA1C      Assessment and Plan:  GERD, Nausea, Bloating, Epigastric Pain:  Concerning for H Pylori Will check CBC, CMET, Amylase, Lipase and H Pylori Discussed taking the Nexium daily, not as needed Advised him to  avoid foods that trigger reflux Will follow up after labs are back  Follow Up Instructions:    I discussed the assessment and treatment plan with the patient. The patient was provided an opportunity to ask questions and all were answered. The patient agreed with the plan and demonstrated an understanding of the instructions.   The patient was advised to call back or seek an in-person evaluation if the symptoms worsen or if the condition fails to  improve as anticipated.     Webb Silversmith, NP

## 2019-02-28 NOTE — Addendum Note (Signed)
Addended by: Ellamae Sia on: 02/28/2019 03:40 PM   Modules accepted: Orders

## 2019-02-28 NOTE — Patient Instructions (Signed)

## 2019-03-02 ENCOUNTER — Other Ambulatory Visit (INDEPENDENT_AMBULATORY_CARE_PROVIDER_SITE_OTHER): Payer: No Typology Code available for payment source

## 2019-03-02 DIAGNOSIS — R140 Abdominal distension (gaseous): Secondary | ICD-10-CM

## 2019-03-02 DIAGNOSIS — K219 Gastro-esophageal reflux disease without esophagitis: Secondary | ICD-10-CM

## 2019-03-02 DIAGNOSIS — R1013 Epigastric pain: Secondary | ICD-10-CM

## 2019-03-02 DIAGNOSIS — R110 Nausea: Secondary | ICD-10-CM

## 2019-03-02 LAB — CBC
HCT: 46.3 % (ref 39.0–52.0)
Hemoglobin: 15.9 g/dL (ref 13.0–17.0)
MCHC: 34.3 g/dL (ref 30.0–36.0)
MCV: 89.2 fl (ref 78.0–100.0)
Platelets: 233 10*3/uL (ref 150.0–400.0)
RBC: 5.19 Mil/uL (ref 4.22–5.81)
RDW: 13.4 % (ref 11.5–15.5)
WBC: 6.3 10*3/uL (ref 4.0–10.5)

## 2019-03-02 LAB — LIPASE: Lipase: 15 U/L (ref 11.0–59.0)

## 2019-03-02 LAB — COMPREHENSIVE METABOLIC PANEL
ALT: 35 U/L (ref 0–53)
AST: 26 U/L (ref 0–37)
Albumin: 4.2 g/dL (ref 3.5–5.2)
Alkaline Phosphatase: 88 U/L (ref 39–117)
BUN: 15 mg/dL (ref 6–23)
CO2: 30 mEq/L (ref 19–32)
Calcium: 9.2 mg/dL (ref 8.4–10.5)
Chloride: 102 mEq/L (ref 96–112)
Creatinine, Ser: 1.04 mg/dL (ref 0.40–1.50)
GFR: 89.36 mL/min (ref >60.00)
Glucose, Bld: 98 mg/dL (ref 70–99)
Potassium: 4 mEq/L (ref 3.5–5.1)
Sodium: 138 mEq/L (ref 135–145)
Total Bilirubin: 0.5 mg/dL (ref 0.2–1.2)
Total Protein: 7.4 g/dL (ref 6.0–8.3)

## 2019-03-02 LAB — AMYLASE: Amylase: 69 U/L (ref 27–131)

## 2019-03-13 ENCOUNTER — Other Ambulatory Visit: Payer: Self-pay

## 2019-03-13 ENCOUNTER — Other Ambulatory Visit (INDEPENDENT_AMBULATORY_CARE_PROVIDER_SITE_OTHER): Payer: No Typology Code available for payment source

## 2019-03-13 DIAGNOSIS — K219 Gastro-esophageal reflux disease without esophagitis: Secondary | ICD-10-CM

## 2019-03-13 DIAGNOSIS — R1013 Epigastric pain: Secondary | ICD-10-CM

## 2019-03-13 DIAGNOSIS — R110 Nausea: Secondary | ICD-10-CM

## 2019-03-13 DIAGNOSIS — R140 Abdominal distension (gaseous): Secondary | ICD-10-CM

## 2019-03-14 ENCOUNTER — Other Ambulatory Visit: Payer: Self-pay | Admitting: Internal Medicine

## 2019-03-14 ENCOUNTER — Telehealth: Payer: Self-pay

## 2019-03-14 LAB — HELICOBACTER PYLORI  SPECIAL ANTIGEN
MICRO NUMBER:: 487550
RESULT:: DETECTED — AB
SPECIMEN QUALITY: ADEQUATE

## 2019-03-14 MED ORDER — PANTOPRAZOLE SODIUM 40 MG PO TBEC: 40.0000 mg | DELAYED_RELEASE_TABLET | Freq: Two times a day (BID) | ORAL | 3 refills | Status: DC

## 2019-03-14 MED ORDER — CLARITHROMYCIN 500 MG PO TABS: 500.0000 mg | ORAL_TABLET | Freq: Two times a day (BID) | ORAL | 0 refills | Status: DC

## 2019-03-14 MED ORDER — METRONIDAZOLE 500 MG PO TABS: 500.0000 mg | ORAL_TABLET | Freq: Three times a day (TID) | ORAL | 0 refills | Status: DC

## 2019-03-14 NOTE — Telephone Encounter (Signed)
[place] Night - Client Nonclinical Telephone Record [facility] Night - Client Client Site [place] Physician Webb Silversmith - NP Contact Type Call Who Is Calling Patient / Member / Family / Caregiver Caller Name Jager Koska Caller Phone Number [phone] Patient Name Donald Austin Patient DOB 1961/09/25 Call Type Message Only Information Provided Reason for Call Request for General Office Information Initial Comment Caller states that he was supposed to bring in a stool sample to the lab yesterday but no one was there. He froze the sample and would like to know if it is possible for him to bring it today or if he needs to schedule an appt. Additional Comment Transferred caller to the office. Call Closed

## 2019-03-14 NOTE — Telephone Encounter (Signed)
Done

## 2019-03-15 ENCOUNTER — Telehealth: Payer: Self-pay

## 2019-03-15 NOTE — Telephone Encounter (Signed)
Pt left v/m; pt picked up med on 03/14/19 and wants to know if can wait to start meds on 03/19/19. Pt request; on 03/14/19 pt picked up biaxin,flagyl and pantoprazole.Please advise.

## 2019-03-16 NOTE — Telephone Encounter (Signed)
Called pt twice and goes straight to VM, but VM not set up

## 2019-03-20 ENCOUNTER — Telehealth: Payer: Self-pay

## 2019-03-20 ENCOUNTER — Telehealth: Payer: Self-pay | Admitting: *Deleted

## 2019-03-20 NOTE — Telephone Encounter (Signed)
[facility] Patient Name: Donald Austin Gender: Male DOB: Feb 25, 1961 Age: 58 Y 22 M 22 D Return Phone Number: [phone] (Primary) Address: City/State/Zip: [place] [address] Client [facility] Night - Client Client Site [place] Physician Webb Silversmith - NP Contact Type Call Who Is Calling Patient / Member / Family / Caregiver Call Type Triage / Clinical Relationship To Patient Self Return Phone Number [phone] (Primary) Chief Complaint Headache Reason for Call Symptomatic / Request for [place] states he was dx with H-pylori and is on 2 abx and other meds. He has a headache and nausea. States it feels like he has a hangover. Translation No Nurse Assessment Nurse: Sarina Ill, RN, Corinne Date/Time (Eastern Time): 03/18/2019 11:13:57 AM Confirm and document reason for call. If symptomatic, describe symptoms. ---Caller reports taking meds for H pylori, taking 3 med pack. Reports headache with this combination. Did not take morning dose. Reports intermittent headache since 3rd day taking medication. Medication effective with abdominal symptoms. Has the patient had close contact with a person known or suspected to have the novel coronavirus illness OR traveled / lives in area with major community spread (including international travel) in the last 14 days from the onset of symptoms? * If Asymptomatic, screen for exposure and travel within the last 14 days. ---No Does the patient have any new or worsening symptoms? ---Yes Will a triage be completed? ---Yes Related visit to physician within the last 2 weeks? ---No Does the PT have any chronic conditions? (i.e. diabetes, asthma, this includes High risk factors for pregnancy, etc.) ---Yes List chronic conditions. ---H pylori Is this a behavioral health  or substance abuse call? ---No Guidelines Guideline Title Affirmed Question Affirmed Notes Nurse Date/Time (Eastern Time) Headache [1] New headache AND [2] age > 69 Sarina Ill, [place], [place] 03/18/2019 11:20:01 AM Disp. Time Eilene Ghazi Time) Disposition Final User PLEASE NOTE: All timestamps contained within this report are represented as [place] Standard Time. CONFIDENTIALTY NOTICE: This fax transmission is intended only for the addressee. It contains information that is legally privileged, confidential or otherwise protected from use or disclosure. If you are not the intended recipient, you are strictly prohibited from reviewing, disclosing, copying using or disseminating any of this information or taking any action in reliance on or regarding this information. If you have received this fax in error, please notify us immediately by telephone so that we can arrange for its return to [place]. Phone: [phone], Toll-Free: [phone], Fax: [phone] Page: 2 of 2 Call Id: [id number] 03/18/2019 11:27:14 AM See PCP within 24 Hours Yes Sarina Ill, RN, Alease Medina Caller Disagree/Comply Comply Caller Understands Yes PreDisposition Call Doctor Care Advice Given Per Guideline SEE PCP WITHIN 24 HOURS: * IF OFFICE WILL BE OPEN: You need to be seen within the next 24 hours. Call your doctor (or NP/PA) when the office opens and make an appointment. ACETAMINOPHEN (E.G., TYLENOL): PAIN MEDICINES: * IF OFFICE WILL BE CLOSED AND NO PCP (PRIMARY CARE PROVIDER) SECOND-LEVEL TRIAGE: You need to be seen within the next 24 hours. A clinic or an urgent care center is often a good source of care if your doctor's office is closed or you can't get an appointment. CAUTION - NSAIDS (E.G., IBUPROFEN, NAPROXEN): * Do not take nonsteroidal anti-inflammatory drugs (NSAIDs) if you have stomach problems, kidney disease, heart failure, or other contraindications to using this type of medicine. CALL BACK  IF: * Blurred vision or double vision  occurs * Numbness or weakness of the face, arm or leg * Difficulty with speaking * You become worse. Referrals REFERRED TO PCP OFFICE

## 2019-03-20 NOTE — Telephone Encounter (Signed)
Patient left a voicemail stating that he had missed a call. Patient stated that he had called earlier and he is having more bowel movements than normal.

## 2019-03-20 NOTE — Telephone Encounter (Signed)
This is not uncommon. He needs to continue medication as prescribed. Can take Tylenol 1000 mg every 8 hours as needed for headache

## 2019-03-20 NOTE — Telephone Encounter (Signed)
This has been addressed.

## 2019-03-20 NOTE — Telephone Encounter (Signed)
Patient also called in with additional symptoms today.  Please see second phone encounter for today.   Will fyi to PCP and to [facility].

## 2019-03-20 NOTE — Telephone Encounter (Signed)
Patient left a voicemail stating that he has taken his medication now for 3 days and has a headache and using the bathroom more than usual. Tried to call patient back to get more information and was not able to leave a message because voicemail not yet set up.Marland Kitchen

## 2019-03-20 NOTE — Telephone Encounter (Signed)
This is 2nd team health on call made by patient over the weekend.  Also refer to his new phone call made to the office earlier today.   PCP is addressing concerns.

## 2019-03-20 NOTE — Telephone Encounter (Signed)
Pt is aware that these Sx are not unusual and he needs to finish meds for treatment of H pylori

## 2019-03-20 NOTE — Telephone Encounter (Signed)
[name] Gender: Male DOB: 1960/12/17 Age: 58 Y 1 M 21 D Return Phone Number: [phone] (Primary) Address: City/State/Zip: [place] [address] Client [facility] Night - Client Client Site [facility] Physician Webb Silversmith - NP Contact Type Call Who Is Calling Patient / Member / Family / Caregiver Call Type Triage / Clinical Relationship To Patient Self Return Phone Number [phone] (Primary) Chief Complaint Urine - unusual color Reason for Call Symptomatic / Request for Health Information Initial Comment Caller has dark urine. Translation No Nurse Assessment Nurse: Alveta Heimlich, RN, Santiago Glad Date/Time Eilene Ghazi Time): 03/17/2019 12:07:07 PM Confirm and document reason for call. If symptomatic, describe symptoms. ---Caller states has been having colored urine. Having some pain in penis but not having pain now denies any odor and the urine is clear this morning not frequency or urgency. No drainage drainage from penis no fever no abdominal pain. Patient started last night Has the patient had close contact with a person known or suspected to have the novel coronavirus illness OR traveled / lives in area with major community spread (including international travel) in the last 14 days from the onset of symptoms? * If Asymptomatic, screen for exposure and travel within the last 14 days. ---No Does the patient have any new or worsening symptoms? ---Yes Will a triage be completed? ---Yes Related visit to physician within the last 2 weeks? ---No Does the PT have any chronic conditions? (i.e. diabetes, asthma, this includes High risk factors for pregnancy, etc.) ---Yes List chronic conditions. ---h pyloria chronic back pain Is this a behavioral health or substance abuse call? ---No Guidelines Guideline Title  Affirmed Question Affirmed Notes Nurse Date/Time Eilene Ghazi Time) Urinary Symptoms All other urine symptoms Germain Osgood 03/17/2019 12:17:48 PM Disp. Time Eilene Ghazi Time) Disposition Final User 03/17/2019 12:22:02 PM See PCP within 2 Weeks Yes Alveta Heimlich, RN, Ralene Bathe NOTE: All timestamps contained within this report are represented as [place] Standard Time. CONFIDENTIALTY NOTICE: This fax transmission is intended only for the addressee. It contains information that is legally privileged, confidential or otherwise protected from use or disclosure. If you are not the intended recipient, you are strictly prohibited from reviewing, disclosing, copying using or disseminating any of this information or taking any action in reliance on or regarding this information. If you have received this fax in error, please notify us immediately by telephone so that we can arrange for its return to [place]. Phone: [phone], Toll-Free: [phone], Fax: [phone] Page: 2 of 2 Call Id: [id number] [place] Disagree/Comply Comply Caller Understands Yes PreDisposition Call Doctor Care Advice Given Per Guideline SEE PCP WITHIN 2 WEEKS: * You need to be seen for this ongoing problem within the next 2 weeks. Call your doctor (or NP/PA) during regular office hours and make an appointment. * Fever occurs * Unable to urinate and bladder feels full * You become worse. CALL BACK IF: CARE ADVICE given per Urinary Symptoms (Adult) guideline.

## 2019-03-20 NOTE — Telephone Encounter (Signed)
He can schedule in office or doxy.me for urinalysis/STD screening

## 2019-04-03 ENCOUNTER — Telehealth: Payer: Self-pay | Admitting: *Deleted

## 2019-04-03 NOTE — Telephone Encounter (Signed)
Patient left a voicemail stating that he went for a treatment that Webb Silversmith NP had ordered for him. Patient wants to know if he should be taking something daily for acid reflux and gas?

## 2019-04-04 NOTE — Telephone Encounter (Signed)
Copied from [place] [id number]. Topic: Quick Communication - See Telephone Encounter >> Apr 03, 2019  4:02 PM Berneta Levins wrote: CRM for notification. See Telephone encounter for: 04/03/19.  Pt states that he finished up h.pylori medication and wants to know if there is something he should take daily for gas.

## 2019-04-04 NOTE — Telephone Encounter (Signed)
Is he still having gas, bloating issues? Or are they better after the treatment.

## 2019-04-05 MED ORDER — PANTOPRAZOLE SODIUM 40 MG PO TBEC: 40.0000 mg | DELAYED_RELEASE_TABLET | Freq: Every day | ORAL | 2 refills | Status: DC

## 2019-04-05 NOTE — Addendum Note (Signed)
Addended by: Lurlean Nanny on: 04/05/2019 12:07 PM   Modules accepted: Orders

## 2019-04-05 NOTE — Telephone Encounter (Signed)
Pt reports overall he is feeling better but still have GERD acid Sx... refilled Protonix for once daily

## 2019-09-13 ENCOUNTER — Other Ambulatory Visit: Payer: Self-pay

## 2019-09-13 ENCOUNTER — Ambulatory Visit
Admission: EM | Admit: 2019-09-13 | Discharge: 2019-09-13 | Disposition: A | Discharge status: Home / Self Care | Payer: Commercial Managed Care - PPO | Attending: Family Medicine | Admitting: Family Medicine

## 2019-09-13 ENCOUNTER — Encounter: Payer: Self-pay | Admitting: Family Medicine

## 2019-09-13 DIAGNOSIS — R1013 Epigastric pain: Secondary | ICD-10-CM | POA: Diagnosis not present

## 2019-09-13 DIAGNOSIS — K219 Gastro-esophageal reflux disease without esophagitis: Secondary | ICD-10-CM | POA: Diagnosis not present

## 2019-09-13 HISTORY — DX: Personal history of other infectious and parasitic diseases: Z86.19

## 2019-09-13 MED ORDER — PANTOPRAZOLE SODIUM 40 MG PO TBEC: 40.0000 mg | DELAYED_RELEASE_TABLET | Freq: Two times a day (BID) | ORAL | 3 refills | Status: DC

## 2019-09-13 NOTE — Discharge Instructions (Signed)
Medication as prescribed.  Follow up with Webb Silversmith.  I recommend seeing GI.  Take care  Dr. Lacinda Axon

## 2019-09-13 NOTE — ED Provider Notes (Signed)
[facility]    CSN: [id number] Arrival date & time: 09/13/19  [id number]  History   Chief Complaint Chief Complaint  Patient presents with  . Abdominal Pain   HPI   58 year old male presents with epigastric pain and belching.  Patient reports that he has had ongoing symptoms since May.  He states that he improved after being treated for H. pylori.  He had a positive test at his primary care physician's office.  He reports that his symptoms were worse this morning.  He had a lot of belching and some heartburn and epigastric pain.  He drinks quite a bit of alcohol, 4 beers approximately 4 times a week.  He eats quite a bit of spicy food as well.  He is not taking a PPI at this time.  Patient reports the he has intermittent left-sided and right-sided chest pain.  He states that it occurs infrequently.  He states that he has had some shortness of breath as well.  He seems to attribute this shortness of breath to his GI symptoms.  He continues to smoke.  No other reported symptoms.  No other complaints.   PMH, Surgical Hx, Family Hx, Social History reviewed and updated as below.  Past Medical History:  Diagnosis Date  . Anemia    NOS  . GERD (gastroesophageal reflux disease)   . History of Helicobacter pylori infection    Past Surgical History:  Procedure Laterality Date  . COLONOSCOPY  2012  . fatty tissue removed from neck  2004   Home Medications    Prior to Admission medications   Medication Sig Start Date End Date Taking? Authorizing Provider  tadalafil (CIALIS) 20 MG tablet Take 1 tablet by mouth as directed.   Yes [provider]  pantoprazole (PROTONIX) 40 MG tablet Take 1 tablet (40 mg total) by mouth 2 (two) times daily before a meal. 09/13/19   Coral Spikes, DO    Family History Family History  Problem Relation Age of Onset  . Hypertension Father   . Kidney failure Father   . Hypertension Mother   . Cancer Sister        breast  . Hypertension  Sister   . Hypertension Sister   . Hypertension Brother   . Hypertension Brother   . Heart attack Neg Hx        <55    Social History Social History   Tobacco Use  . Smoking status: Current Every Day Smoker    Packs/day: 1.00    Types: Cigarettes  . Smokeless tobacco: Never Used  . Tobacco comment: quit 4 days ago, 20pyh  Substance Use Topics  . Alcohol use: Yes    Alcohol/week: 4.0 standard drinks    Types: 4 Cans of beer per week  . Drug use: Not Currently    Types: Marijuana     Allergies   Patient has no known allergies.   Review of Systems Review of Systems  Respiratory: Positive for shortness of breath.   Gastrointestinal: Positive for abdominal pain.       Belching.   Physical Exam Triage Vital Signs ED Triage Vitals  Enc Vitals Group     BP 09/13/19 0847 (!) 154/132     Pulse Rate 09/13/19 0847 65     Resp 09/13/19 0847 18     Temp 09/13/19 0847 97.8 F (36.6 C)     Temp Source 09/13/19 0847 Oral     SpO2 09/13/19 0847 100 %  Weight 09/13/19 0849 195 lb (88.5 kg)     Height 09/13/19 0849 6\' 1"  (1.854 m)     Head Circumference --      Peak Flow --      Pain Score 09/13/19 0847 3     Pain Loc --      Pain Edu? --      Excl. in [place]? --    Updated Vital Signs BP (!) 136/105 (BP Location: Right Arm)   Pulse 65   Temp 97.8 F (36.6 C) (Oral)   Resp 18   Ht 6\' 1"  (1.854 m)   Wt 88.5 kg   SpO2 100%   BMI 25.73 kg/m   Visual Acuity Right Eye Distance:   Left Eye Distance:   Bilateral Distance:    Right Eye Near:   Left Eye Near:    Bilateral Near:     Physical Exam Vitals signs and nursing note reviewed.  Constitutional:      General: He is not in acute distress.    Appearance: Normal appearance. He is not ill-appearing.  HENT:     Head: Normocephalic and atraumatic.  Eyes:     General:        Right eye: No discharge.        Left eye: No discharge.     Conjunctiva/sclera: Conjunctivae normal.  Cardiovascular:     Rate and  Rhythm: Normal rate and regular rhythm.     Heart sounds: No murmur.  Pulmonary:     Effort: Pulmonary effort is normal.     Breath sounds: Normal breath sounds. No wheezing, rhonchi or rales.  Abdominal:     General: There is no distension.     Palpations: Abdomen is soft.     Tenderness: There is no abdominal tenderness. There is no guarding or rebound.  Neurological:     Mental Status: He is alert.  Psychiatric:        Mood and Affect: Mood normal.        Behavior: Behavior normal.    UC Treatments / Results  Labs (all labs ordered are listed, but only abnormal results are displayed) Labs Reviewed - No data to display  EKG   Radiology No results found.  Procedures Procedures (including critical care time)  Medications Ordered in UC Medications - No data to display  Initial Impression / Assessment and Plan / UC Course  I have reviewed the triage vital signs and the nursing notes.  Pertinent labs & imaging results that were available during my care of the patient were reviewed by me and considered in my medical decision making (see chart for details).    58 year old male presents with reported epigastric pain and belching.  Patient appears to have GERD.  May have gastritis as well due to alcohol use.  He is otherwise well at this time.  Placing on Protonix.  Advised that he needs to see gastroenterology as he has is having symptoms and has had prior H. pylori.  Final Clinical Impressions(s) / UC Diagnoses   Final diagnoses:  Epigastric pain  Gastroesophageal reflux disease, unspecified whether esophagitis present     Discharge Instructions     Medication as prescribed.  Follow up with Webb Silversmith.  I recommend seeing GI.  Take care  Dr. Lacinda Axon    ED Prescriptions    Medication Sig Dispense Auth. Provider   pantoprazole (PROTONIX) 40 MG tablet Take 1 tablet (40 mg total) by mouth 2 (two) times daily before a  meal. 60 tablet Coral Spikes, DO     PDMP  not reviewed this encounter.   Coral Spikes, [place] 09/13/19 1008

## 2019-09-13 NOTE — ED Triage Notes (Signed)
Patient in today c/o epigastric pain and belching since this morning. Patient has had this problem in the past and was given medication, but patient is not taking anything now.

## 2019-09-24 ENCOUNTER — Ambulatory Visit: Payer: Commercial Managed Care - PPO | Admitting: Internal Medicine

## 2019-09-24 NOTE — Progress Notes (Deleted)
Subjective:    Patient ID: Donald Austin, male    DOB: 10-18-1961, 58 y.o.   MRN: [id number]  HPI  Pt presents to the clinic today with c/o a knot above his left knee. He noticed this.  Review of Systems  Past Medical History:  Diagnosis Date  . Anemia    NOS  . GERD (gastroesophageal reflux disease)   . History of Helicobacter pylori infection     Current Outpatient Medications  Medication Sig Dispense Refill  . pantoprazole (PROTONIX) 40 MG tablet Take 1 tablet (40 mg total) by mouth 2 (two) times daily before a meal. 60 tablet 3  . tadalafil (CIALIS) 20 MG tablet Take 1 tablet by mouth as directed.     No current facility-administered medications for this visit.     No Known Allergies  Family History  Problem Relation Age of Onset  . Hypertension Father   . Kidney failure Father   . Hypertension Mother   . Cancer Sister        breast  . Hypertension Sister   . Hypertension Sister   . Hypertension Brother   . Hypertension Brother   . Heart attack Neg Hx        <55    Social History   Socioeconomic History  . Marital status: Married    Spouse name: Not on file  . Number of children: 3  . Years of education: Not on file  . Highest education level: Not on file  Occupational History  . Occupation: Ecologist    Comment: [employer]  . Financial resource strain: Not on file  . Food insecurity    Worry: Not on file    Inability: Not on file  . Transportation needs    Medical: Not on file    Non-medical: Not on file  Tobacco Use  . Smoking status: Current Every Day Smoker    Packs/day: 1.00    Types: Cigarettes  . Smokeless tobacco: Never Used  . Tobacco comment: quit 4 days ago, 20pyh  Substance and Sexual Activity  . Alcohol use: Yes    Alcohol/week: 4.0 standard drinks    Types: 4 Cans of beer per week  . Drug use: Not Currently    Types: Marijuana  . Sexual activity: Not on file  Lifestyle  . Physical activity   Days per week: Not on file    Minutes per session: Not on file  . Stress: Not on file  Relationships  . Social Herbalist on phone: Not on file    Gets together: Not on file    Attends religious service: Not on file    Active member of club or organization: Not on file    Attends meetings of clubs or organizations: Not on file    Relationship status: Not on file  . Intimate partner violence    Fear of current or ex partner: Not on file    Emotionally abused: Not on file    Physically abused: Not on file    Forced sexual activity: Not on file  Other Topics Concern  . Not on file  Social History Narrative   Regular exercise--no   Children 3 healthy   Diet: Avoids fried food, baked, occ. Fruits and veggies     Constitutional: Denies fever, malaise, fatigue, headache or abrupt weight changes.  HEENT: Denies eye pain, eye redness, ear pain, ringing in the ears, wax buildup, runny nose, nasal congestion,  bloody nose, or sore throat. Respiratory: Denies difficulty breathing, shortness of breath, cough or sputum production.   Cardiovascular: Denies chest pain, chest tightness, palpitations or swelling in the hands or feet.  Gastrointestinal: Denies abdominal pain, bloating, constipation, diarrhea or blood in the stool.  GU: Denies urgency, frequency, pain with urination, burning sensation, blood in urine, odor or discharge. Musculoskeletal: Denies decrease in range of motion, difficulty with gait, muscle pain or joint pain and swelling.  Skin: Denies redness, rashes, lesions or ulcercations.  Neurological: Denies dizziness, difficulty with memory, difficulty with speech or problems with balance and coordination.  Psych: Denies anxiety, depression, SI/HI.  No other specific complaints in a complete review of systems (except as listed in HPI above).     Objective:   Physical Exam   There were no vitals taken for this visit. Wt Readings from Last 3 Encounters:  09/13/19  195 lb (88.5 kg)  02/28/19 203 lb (92.1 kg)  11/28/18 200 lb (90.7 kg)    General: Appears their stated age, well developed, well nourished in NAD. Skin: Warm, dry and intact. No rashes, lesions or ulcerations noted. HEENT: Head: normal shape and size; Eyes: sclera white, no icterus, conjunctiva pink, PERRLA and EOMs intact; Ears: Tm's gray and intact, normal light reflex; Nose: mucosa pink and moist, septum midline; Throat/Mouth: Teeth present, mucosa pink and moist, no exudate, lesions or ulcerations noted.  Neck:  Neck supple, trachea midline. No masses, lumps or thyromegaly present.  Cardiovascular: Normal rate and rhythm. S1,S2 noted.  No murmur, rubs or gallops noted. No JVD or BLE edema. No carotid bruits noted. Pulmonary/Chest: Normal effort and positive vesicular breath sounds. No respiratory distress. No wheezes, rales or ronchi noted.  Abdomen: Soft and nontender. Normal bowel sounds. No distention or masses noted. Liver, spleen and kidneys non palpable. Musculoskeletal: Normal range of motion. No signs of joint swelling. No difficulty with gait.  Neurological: Alert and oriented. Cranial nerves II-XII grossly intact. Coordination normal.  Psychiatric: Mood and affect normal. Behavior is normal. Judgment and thought content normal.   EKG:  BMET    Component Value Date/Time   NA 138 03/02/2019 1032   K 4.0 03/02/2019 1032   CL 102 03/02/2019 1032   CO2 30 03/02/2019 1032   GLUCOSE 98 03/02/2019 1032   BUN 15 03/02/2019 1032   CREATININE 1.04 03/02/2019 1032   CALCIUM 9.2 03/02/2019 1032   GFRNONAA >60 05/01/2018 2221   GFRAA >60 05/01/2018 2221    Lipid Panel     Component Value Date/Time   CHOL 171 09/12/2018 1004   TRIG 75.0 09/12/2018 1004   HDL 33.90 (L) 09/12/2018 1004   CHOLHDL 5 09/12/2018 1004   VLDL 15.0 09/12/2018 1004   LDLCALC 122 (H) 09/12/2018 1004    CBC    Component Value Date/Time   WBC 6.3 03/02/2019 1032   RBC 5.19 03/02/2019 1032   HGB  15.9 03/02/2019 1032   HCT 46.3 03/02/2019 1032   PLT 233.0 03/02/2019 1032   MCV 89.2 03/02/2019 1032   MCH 30.0 05/01/2018 2221   MCHC 34.3 03/02/2019 1032   RDW 13.4 03/02/2019 1032   LYMPHSABS 2.5 12/25/2010 1643   MONOABS 0.7 12/25/2010 1643   EOSABS 0.1 12/25/2010 1643   BASOSABS 0.0 12/25/2010 1643    Hgb A1C No results found for: HGBA1C         Assessment & Plan:

## 2019-09-27 MED ORDER — DEXLANSOPRAZOLE 30 MG PO CPDR: 30.0000 mg | DELAYED_RELEASE_CAPSULE | Freq: Two times a day (BID) | ORAL | 2 refills | Status: DC

## 2019-09-27 NOTE — Telephone Encounter (Signed)
Pt left v/m that acid reflux med, protonix is not working any longer. Pt has a lot of burping even on the protonix. Pt request different dosage that is stronger or a different med for acid reflux. [facility] [place]. Pt request cb.

## 2019-09-27 NOTE — Addendum Note (Signed)
Addended by: Jearld Fenton on: 09/27/2019 01:22 PM   Modules accepted: Orders

## 2019-09-27 NOTE — Telephone Encounter (Signed)
Sent in [place]. He should let me know if this does not help. Next step would be to have him see GI.

## 2019-10-01 ENCOUNTER — Other Ambulatory Visit: Payer: Self-pay | Admitting: Orthopedic Surgery

## 2019-10-01 DIAGNOSIS — R2241 Localized swelling, mass and lump, right lower limb: Secondary | ICD-10-CM

## 2019-10-12 ENCOUNTER — Ambulatory Visit: Payer: Commercial Managed Care - PPO

## 2019-10-25 ENCOUNTER — Other Ambulatory Visit: Payer: Self-pay

## 2019-10-25 ENCOUNTER — Ambulatory Visit
Admission: RE | Admit: 2019-10-25 | Discharge: 2019-10-25 | Disposition: A | Discharge status: Home / Self Care | Payer: Commercial Managed Care - PPO | Source: Ambulatory Visit | Attending: Orthopedic Surgery | Admitting: Orthopedic Surgery

## 2019-10-25 DIAGNOSIS — R2241 Localized swelling, mass and lump, right lower limb: Secondary | ICD-10-CM | POA: Insufficient documentation

## 2019-10-25 MED ORDER — GADOBUTROL 1 MMOL/ML IV SOLN
8.0000 mL | Freq: Once | INTRAVENOUS | Status: AC | PRN
  Administered 2019-10-25: 8 mL via INTRAVENOUS

## 2019-11-10 ENCOUNTER — Encounter: Payer: Self-pay | Admitting: Emergency Medicine

## 2019-11-10 ENCOUNTER — Emergency Department
Admission: EM | Admit: 2019-11-10 | Discharge: 2019-11-10 | Disposition: A | Discharge status: Home / Self Care | Payer: Commercial Managed Care - PPO | Attending: Emergency Medicine | Admitting: Emergency Medicine

## 2019-11-10 ENCOUNTER — Other Ambulatory Visit: Payer: Self-pay

## 2019-11-10 DIAGNOSIS — Z79899 Other long term (current) drug therapy: Secondary | ICD-10-CM | POA: Diagnosis not present

## 2019-11-10 DIAGNOSIS — F17210 Nicotine dependence, cigarettes, uncomplicated: Secondary | ICD-10-CM | POA: Insufficient documentation

## 2019-11-10 DIAGNOSIS — Z20822 Contact with and (suspected) exposure to covid-19: Secondary | ICD-10-CM | POA: Diagnosis not present

## 2019-11-10 LAB — SARS CORONAVIRUS 2 (TAT 6-24 HRS): SARS Coronavirus 2: NEGATIVE

## 2019-11-10 NOTE — ED Triage Notes (Signed)
Patient exposed to COVID 5 days ago. Denies symptoms.

## 2019-11-10 NOTE — ED Provider Notes (Signed)
Emergency Department Provider Note  ____________________________________________  Time seen: Approximately 5:56 PM  I have reviewed the triage vital signs and the nursing notes.   HISTORY  Chief Complaint COVID exposure   Historian Patient    HPI Donald Austin is a 59 y.o. male presents to the emergency department for COVID-19 testing.  Patient reports that he was exposed to Covid approximately 5 days ago and would like to be tested.  He is not currently asymptomatic at this time.   Past Medical History:  Diagnosis Date  . Anemia    NOS  . GERD (gastroesophageal reflux disease)   . History of Helicobacter pylori infection      Immunizations up to date:  Yes.     Past Medical History:  Diagnosis Date  . Anemia    NOS  . GERD (gastroesophageal reflux disease)   . History of Helicobacter pylori infection     Patient Active Problem List   Diagnosis Date Noted  . Erectile dysfunction 07/13/2018  . ANEMIA-NOS 12/25/2010  . Peripheral vascular disease ([place]) 06/09/2007  . GERD 06/09/2007    Past Surgical History:  Procedure Laterality Date  . COLONOSCOPY  2012  . fatty tissue removed from neck  2004    Prior to Admission medications   Medication Sig Start Date End Date Taking? Authorizing Provider  Dexlansoprazole 30 MG capsule Take 1 capsule (30 mg total) by mouth 2 (two) times daily. 09/27/19   Jearld Fenton, NP  pantoprazole (PROTONIX) 40 MG tablet Take 1 tablet (40 mg total) by mouth 2 (two) times daily before a meal. 09/13/19   Cook, Michael [place] G, DO  tadalafil (CIALIS) 20 MG tablet Take 1 tablet by mouth as directed.    [provider]    Allergies Patient has no known allergies.  Family History  Problem Relation Age of Onset  . Hypertension Father   . Kidney failure Father   . Hypertension Mother   . Cancer Sister        breast  . Hypertension Sister   . Hypertension Sister   . Hypertension Brother   . Hypertension Brother   .  Heart attack Neg Hx        <55    Social History Social History   Tobacco Use  . Smoking status: Current Every Day Smoker    Packs/day: 1.00    Types: Cigarettes  . Smokeless tobacco: Never Used  . Tobacco comment: quit 4 days ago, 20pyh  Substance Use Topics  . Alcohol use: Yes    Alcohol/week: 4.0 standard drinks    Types: 4 Cans of beer per week  . Drug use: Not Currently    Types: Marijuana     Review of Systems  Constitutional: No fever/chills Eyes:  No discharge ENT: No upper respiratory complaints. Respiratory: no cough. No SOB/ use of accessory muscles to breath Gastrointestinal:   No nausea, no vomiting.  No diarrhea.  No constipation. Musculoskeletal: Negative for musculoskeletal pain. Skin: Negative for rash, abrasions, lacerations, ecchymosis. ____________________________________________   PHYSICAL EXAM:  VITAL SIGNS: ED Triage Vitals  Enc Vitals Group     BP 11/10/19 1546 (!) 147/93     Pulse Rate 11/10/19 1546 77     Resp 11/10/19 1546 18     Temp 11/10/19 1550 99.3 F (37.4 C)     Temp Source 11/10/19 1550 Oral     SpO2 11/10/19 1546 98 %     Weight 11/10/19 1551 190 lb (86.2 kg)  Height 11/10/19 1551 6' (1.829 m)     Head Circumference --      Peak Flow --      Pain Score 11/10/19 1551 0     Pain Loc --      Pain Edu? --      Excl. in [place]? --      Constitutional: Alert and oriented. Well appearing and in no acute distress. Eyes: Conjunctivae are normal. PERRL. EOMI. Head: Atraumatic. ENT:      Nose: No congestion/rhinnorhea.      Mouth/Throat: Mucous membranes are moist.  Neck: No stridor.  No cervical spine tenderness to palpation. Hematological/Lymphatic/Immunilogical: No cervical lymphadenopathy. Cardiovascular: Normal rate, regular rhythm. Normal S1 and S2.  Good peripheral circulation. Respiratory: Normal respiratory effort without tachypnea or retractions. Lungs CTAB. Good air entry to the bases with no decreased or absent  breath sounds Gastrointestinal: Bowel sounds x 4 quadrants. Soft and nontender to palpation. No guarding or rigidity. No distention. Musculoskeletal: Full range of motion to all extremities. No obvious deformities noted Neurologic:  Normal for age. No gross focal neurologic deficits are appreciated.  Skin:  Skin is warm, dry and intact. No rash noted. Psychiatric: Mood and affect are normal for age. Speech and behavior are normal.   ____________________________________________   LABS (all labs ordered are listed, but only abnormal results are displayed)  Labs Reviewed  SARS CORONAVIRUS 2 (TAT 6-24 HRS)   ____________________________________________  EKG   ____________________________________________  RADIOLOGY   No results found.  ____________________________________________    PROCEDURES  Procedure(s) performed:     Procedures     Medications - No data to display   ____________________________________________   INITIAL IMPRESSION / ASSESSMENT AND PLAN / ED COURSE  Pertinent labs & imaging results that were available during my care of the patient were reviewed by me and considered in my medical decision making (see chart for details).      Assessment and plan COVID-19 exposure 59 year old male presents to the emergency department for COVID-19 testing after being exposed 5 days ago.  He is currently asymptomatic at this time.  COVID-19 sent off testing is in process at this time.  Return precautions were given.  All patient questions were answered.  ERUBEY SUPPES was evaluated in Emergency Department on 11/10/2019 for the symptoms described in the history of present illness. He was evaluated in the context of the global COVID-19 pandemic, which necessitated consideration that the patient might be at risk for infection with the SARS-CoV-2 virus that causes COVID-19. Institutional protocols and algorithms that pertain to the evaluation of patients at risk  for COVID-19 are in a state of rapid change based on information released by regulatory bodies including the CDC and federal and state organizations. These policies and algorithms were followed during the patient's care in the ED.     ____________________________________________  FINAL CLINICAL IMPRESSION(S) / ED DIAGNOSES  Final diagnoses:  Exposure to COVID-19 virus      NEW MEDICATIONS STARTED DURING THIS VISIT:  ED Discharge Orders    None          This chart was dictated using voice recognition software/Dragon. Despite best efforts to proofread, errors can occur which can change the meaning. Any change was purely unintentional.     Lannie Fields, PA-C 11/10/19 Orville Govern, MD 11/10/19 2241

## 2019-11-10 NOTE — ED Notes (Signed)
Pt ambulatory from triage in NAD, pt reports recent covid exposure. Reports no symptoms.

## 2019-11-12 ENCOUNTER — Telehealth: Payer: Self-pay | Admitting: Emergency Medicine

## 2019-11-12 NOTE — Telephone Encounter (Signed)
Called for covid results.  He is currently at work.  Gave him results.  Says he was exposed to someone last Monday and that is why he came in for testing.  Says no symptoms at the time of the test.  I told him that he could still turn positive in the mean time.  He then says he became symptomatic with "chills" the day after the test.  I told him to call health department to ask about further testing.

## 2020-01-08 ENCOUNTER — Telehealth: Payer: Self-pay | Admitting: Internal Medicine

## 2020-01-08 ENCOUNTER — Other Ambulatory Visit: Payer: Self-pay | Admitting: Urology

## 2020-01-08 NOTE — Telephone Encounter (Signed)
Pt called requesting a referral to urology. He states he has discussed this previously with Rollene Fare. I advised him I would send a message to request it but he may have to come in for an appointment.

## 2020-01-09 NOTE — Telephone Encounter (Signed)
Called to schedule patient for an appointment. No answer and no voicemail set up.

## 2020-01-11 ENCOUNTER — Ambulatory Visit: Payer: Commercial Managed Care - PPO

## 2020-01-15 ENCOUNTER — Encounter: Payer: Self-pay | Admitting: Internal Medicine

## 2020-01-15 ENCOUNTER — Ambulatory Visit (INDEPENDENT_AMBULATORY_CARE_PROVIDER_SITE_OTHER): Payer: Commercial Managed Care - PPO | Admitting: Internal Medicine

## 2020-01-15 ENCOUNTER — Telehealth: Payer: Self-pay | Admitting: *Deleted

## 2020-01-15 DIAGNOSIS — U071 COVID-19: Secondary | ICD-10-CM | POA: Diagnosis not present

## 2020-01-15 NOTE — Progress Notes (Signed)
Virtual Visit via Video Note  I connected with Donald Austin on 01/15/20 at  4:15 PM EDT by a video enabled telemedicine application and verified that I am speaking with the correct person using two identifiers.  Location: Patient: Home Provider: Office   I discussed the limitations of evaluation and management by telemedicine and the availability of in person appointments. The patient expressed understanding and agreed to proceed.  History of Present Illness:  Pt reports nasal dryness and intermittent chest tightness. He denies nasal congestion, runny nose or loss of taste. He describes the chest tightness but denies chest pain, cough, or SOB. He denies headache, ear pain, loss of taste, or sore throat. He has had some mild abdominal pain that is intermittent. He denies nausea, vomiting, diarrhea or blood in his stool. He denies fever or body aches but has had some chills. He was diagnosed with Covid yesterday. His daughter also recently tested positive for Covid as well.    Past Medical History:  Diagnosis Date  . Anemia    NOS  . GERD (gastroesophageal reflux disease)   . History of Helicobacter pylori infection     Current Outpatient Medications  Medication Sig Dispense Refill  . Dexlansoprazole 30 MG capsule Take 1 capsule (30 mg total) by mouth 2 (two) times daily. 60 capsule 2  . pantoprazole (PROTONIX) 40 MG tablet Take 1 tablet (40 mg total) by mouth 2 (two) times daily before a meal. 60 tablet 3  . tadalafil (CIALIS) 20 MG tablet TAKE 1 TABLET BY MOUTH AS NEEDED 10 tablet 4   No current facility-administered medications for this visit.    No Known Allergies  Family History  Problem Relation Age of Onset  . Hypertension Father   . Kidney failure Father   . Hypertension Mother   . Cancer Sister        breast  . Hypertension Sister   . Hypertension Sister   . Hypertension Brother   . Hypertension Brother   . Heart attack Neg Hx        <55    Social  History   Socioeconomic History  . Marital status: Married    Spouse name: Not on file  . Number of children: 3  . Years of education: Not on file  . Highest education level: Not on file  Occupational History  . Occupation: Ecologist    Comment: [employer]  Tobacco Use  . Smoking status: Current Every Day Smoker    Packs/day: 1.00    Types: Cigarettes  . Smokeless tobacco: Never Used  . Tobacco comment: quit 4 days ago, 20pyh  Substance and Sexual Activity  . Alcohol use: Yes    Alcohol/week: 4.0 standard drinks    Types: 4 Cans of beer per week  . Drug use: Not Currently    Types: Marijuana  . Sexual activity: Not on file  Other Topics Concern  . Not on file  Social History Narrative   Regular exercise--no   Children 3 healthy   Diet: Avoids fried food, baked, occ. Fruits and veggies   Social Determinants of Health   Financial Resource Strain:   . Difficulty of Paying Living Expenses:   Food Insecurity:   . Worried About Charity fundraiser in the Last Year:   . Arboriculturist in the Last Year:   Transportation Needs:   . Film/video editor (Medical):   Marland Kitchen Lack of Transportation (Non-Medical):   Physical Activity:   .  Days of Exercise per Week:   . Minutes of Exercise per Session:   Stress:   . Feeling of Stress :   Social Connections:   . Frequency of Communication with Friends and Family:   . Frequency of Social Gatherings with Friends and Family:   . Attends Religious Services:   . Active Member of Clubs or Organizations:   . Attends Archivist Meetings:   Marland Kitchen Marital Status:   Intimate Partner Violence:   . Fear of Current or Ex-Partner:   . Emotionally Abused:   Marland Kitchen Physically Abused:   . Sexually Abused:      Constitutional: Denies fever, malaise, fatigue, headache or abrupt weight changes.  HEENT: Pt reports dry nose. Denies eye pain, eye redness, ear pain, ringing in the ears, wax buildup, runny nose, nasal congestion, bloody nose,  or sore throat. Respiratory: Denies difficulty breathing, shortness of breath, cough or sputum production.   Cardiovascular: Pt reports chest tightness. Denies chest pain, palpitations or swelling in the hands or feet.  Gastrointestinal: Pt reports intermittent abdominal pain. Denies bloating, constipation, diarrhea or blood in the stool.  Skin: Denies redness, rashes, lesions or ulcercations.   No other specific complaints in a complete review of systems (except as listed in HPI above).  Observations/Objective:  Wt Readings from Last 3 Encounters:  11/10/19 190 lb (86.2 kg)  09/13/19 195 lb (88.5 kg)  02/28/19 203 lb (92.1 kg)    General: Appears his stated age, well developed, well nourished in NAD. Skin: Warm, dry and intact. No rashes noted. HEENT: Head: normal shape and size; Nose:; Throat/Mouth: Marland Kitchen  Pulmonary/Chest: Normal effort. No respiratory distress.  Neurological: Alert and oriented.   BMET    Component Value Date/Time   NA 138 03/02/2019 1032   K 4.0 03/02/2019 1032   CL 102 03/02/2019 1032   CO2 30 03/02/2019 1032   GLUCOSE 98 03/02/2019 1032   BUN 15 03/02/2019 1032   CREATININE 1.04 03/02/2019 1032   CALCIUM 9.2 03/02/2019 1032   GFRNONAA >60 05/01/2018 2221   GFRAA >60 05/01/2018 2221    Lipid Panel     Component Value Date/Time   CHOL 171 09/12/2018 1004   TRIG 75.0 09/12/2018 1004   HDL 33.90 (L) 09/12/2018 1004   CHOLHDL 5 09/12/2018 1004   VLDL 15.0 09/12/2018 1004   LDLCALC 122 (H) 09/12/2018 1004    CBC    Component Value Date/Time   WBC 6.3 03/02/2019 1032   RBC 5.19 03/02/2019 1032   HGB 15.9 03/02/2019 1032   HCT 46.3 03/02/2019 1032   PLT 233.0 03/02/2019 1032   MCV 89.2 03/02/2019 1032   MCH 30.0 05/01/2018 2221   MCHC 34.3 03/02/2019 1032   RDW 13.4 03/02/2019 1032   LYMPHSABS 2.5 12/25/2010 1643   MONOABS 0.7 12/25/2010 1643   EOSABS 0.1 12/25/2010 1643   BASOSABS 0.0 12/25/2010 1643    Hgb A1C No results found for:  HGBA1C      Assessment and Plan:  Covid 19:  Encouraged rest and fluids He can take Nasal Saline for dry nose He can Zyrtec OTC  Encouraged him to wash his hands frequently and self quarantine x 10 days  ER precautions discussed  Follow Up Instructions:    I discussed the assessment and treatment plan with the patient. The patient was provided an opportunity to ask questions and all were answered. The patient agreed with the plan and demonstrated an understanding of the instructions.   The patient  was advised to call back or seek an in-person evaluation if the symptoms worsen or if the condition fails to improve as anticipated.     Webb Silversmith, NP

## 2020-01-15 NOTE — Telephone Encounter (Signed)
Will discuss at upcoming appt.

## 2020-01-15 NOTE — Patient Instructions (Signed)
COVID-19 COVID-19 is a respiratory infection that is caused by a virus called severe acute respiratory syndrome coronavirus 2 (SARS-CoV-2). The disease is also known as coronavirus disease or novel coronavirus. In some people, the virus may not cause any symptoms. In others, it may cause a serious infection. The infection can get worse quickly and can lead to complications, such as:  Pneumonia, or infection of the lungs.  Acute respiratory distress syndrome or ARDS. This is a condition in which fluid build-up in the lungs prevents the lungs from filling with air and passing oxygen into the blood.  Acute respiratory failure. This is a condition in which there is not enough oxygen passing from the lungs to the body or when carbon dioxide is not passing from the lungs out of the body.  Sepsis or septic shock. This is a serious bodily reaction to an infection.  Blood clotting problems.  Secondary infections due to bacteria or fungus.  Organ failure. This is when your body's organs stop working. The virus that causes COVID-19 is contagious. This means that it can spread from person to person through droplets from coughs and sneezes (respiratory secretions). What are the causes? This illness is caused by a virus. You may catch the virus by:  Breathing in droplets from an infected person. Droplets can be spread by a person breathing, speaking, singing, coughing, or sneezing.  Touching something, like a table or a doorknob, that was exposed to the virus (contaminated) and then touching your mouth, nose, or eyes. What increases the risk? Risk for infection You are more likely to be infected with this virus if you:  Are within 6 feet (2 meters) of a person with COVID-19.  Provide care for or live with a person who is infected with COVID-19.  Spend time in crowded indoor spaces or live in shared housing. Risk for serious illness You are more likely to become seriously ill from the virus if you:   Are 50 years of age or older. The higher your age, the more you are at risk for serious illness.  Live in a nursing home or long-term care facility.  Have cancer.  Have a long-term (chronic) disease such as: ? Chronic lung disease, including chronic obstructive pulmonary disease or asthma. ? A long-term disease that lowers your body's ability to fight infection (immunocompromised). ? Heart disease, including heart failure, a condition in which the arteries that lead to the heart become narrow or blocked (coronary artery disease), a disease which makes the heart muscle thick, weak, or stiff (cardiomyopathy). ? Diabetes. ? Chronic kidney disease. ? Sickle cell disease, a condition in which red blood cells have an abnormal "sickle" shape. ? Liver disease.  Are obese. What are the signs or symptoms? Symptoms of this condition can range from mild to severe. Symptoms may appear any time from 2 to 14 days after being exposed to the virus. They include:  A fever or chills.  A cough.  Difficulty breathing.  Headaches, body aches, or muscle aches.  Runny or stuffy (congested) nose.  A sore throat.  New loss of taste or smell. Some people may also have stomach problems, such as nausea, vomiting, or diarrhea. Other people may not have any symptoms of COVID-19. How is this diagnosed? This condition may be diagnosed based on:  Your signs and symptoms, especially if: ? You live in an area with a COVID-19 outbreak. ? You recently traveled to or from an area where the virus is common. ? You   provide care for or live with a person who was diagnosed with COVID-19. ? You were exposed to a person who was diagnosed with COVID-19.  A physical exam.  Lab tests, which may include: ? Taking a sample of fluid from the back of your nose and throat (nasopharyngeal fluid), your nose, or your throat using a swab. ? A sample of mucus from your lungs (sputum). ? Blood tests.  Imaging tests, which  may include, X-rays, CT scan, or ultrasound. How is this treated? At present, there is no medicine to treat COVID-19. Medicines that treat other diseases are being used on a trial basis to see if they are effective against COVID-19. Your health care provider will talk with you about ways to treat your symptoms. For most people, the infection is mild and can be managed at home with rest, fluids, and over-the-counter medicines. Treatment for a serious infection usually takes places in a hospital intensive care unit (ICU). It may include one or more of the following treatments. These treatments are given until your symptoms improve.  Receiving fluids and medicines through an IV.  Supplemental oxygen. Extra oxygen is given through a tube in the nose, a face mask, or a hood.  Positioning you to lie on your stomach (prone position). This makes it easier for oxygen to get into the lungs.  Continuous positive airway pressure (CPAP) or bi-level positive airway pressure (BPAP) machine. This treatment uses mild air pressure to keep the airways open. A tube that is connected to a motor delivers oxygen to the body.  Ventilator. This treatment moves air into and out of the lungs by using a tube that is placed in your windpipe.  Tracheostomy. This is a procedure to create a hole in the neck so that a breathing tube can be inserted.  Extracorporeal membrane oxygenation (ECMO). This procedure gives the lungs a chance to recover by taking over the functions of the heart and lungs. It supplies oxygen to the body and removes carbon dioxide. Follow these instructions at home: Lifestyle  If you are sick, stay home except to get medical care. Your health care provider will tell you how long to stay home. Call your health care provider before you go for medical care.  Rest at home as told by your health care provider.  Do not use any products that contain nicotine or tobacco, such as cigarettes, e-cigarettes, and  chewing tobacco. If you need help quitting, ask your health care provider.  Return to your normal activities as told by your health care provider. Ask your health care provider what activities are safe for you. General instructions  Take over-the-counter and prescription medicines only as told by your health care provider.  Drink enough fluid to keep your urine pale yellow.  Keep all follow-up visits as told by your health care provider. This is important. How is this prevented?  There is no vaccine to help prevent COVID-19 infection. However, there are steps you can take to protect yourself and others from this virus. To protect yourself:   Do not travel to areas where COVID-19 is a risk. The areas where COVID-19 is reported change often. To identify high-risk areas and travel restrictions, check the CDC travel website: wwwnc.cdc.gov/travel/notices  If you live in, or must travel to, an area where COVID-19 is a risk, take precautions to avoid infection. ? Stay away from people who are sick. ? Wash your hands often with soap and water for 20 seconds. If soap and water   are not available, use an alcohol-based hand sanitizer. ? Avoid touching your mouth, face, eyes, or nose. ? Avoid going out in public, follow guidance from your state and local health authorities. ? If you must go out in public, wear a cloth face covering or face mask. Make sure your mask covers your nose and mouth. ? Avoid crowded indoor spaces. Stay at least 6 feet (2 meters) away from others. ? Disinfect objects and surfaces that are frequently touched every day. This may include:  Counters and tables.  Doorknobs and light switches.  Sinks and faucets.  Electronics, such as phones, remote controls, keyboards, computers, and tablets. To protect others: If you have symptoms of COVID-19, take steps to prevent the virus from spreading to others.  If you think you have a COVID-19 infection, contact your health care  provider right away. Tell your health care team that you think you may have a COVID-19 infection.  Stay home. Leave your house only to seek medical care. Do not use public transport.  Do not travel while you are sick.  Wash your hands often with soap and water for 20 seconds. If soap and water are not available, use alcohol-based hand sanitizer.  Stay away from other members of your household. Let healthy household members care for children and pets, if possible. If you have to care for children or pets, wash your hands often and wear a mask. If possible, stay in your own room, separate from others. Use a different bathroom.  Make sure that all people in your household wash their hands well and often.  Cough or sneeze into a tissue or your sleeve or elbow. Do not cough or sneeze into your hand or into the air.  Wear a cloth face covering or face mask. Make sure your mask covers your nose and mouth. Where to find more information  Centers for Disease Control and Prevention: www.cdc.gov/coronavirus/2019-ncov/index.html  World Health Organization: www.who.int/health-topics/coronavirus Contact a health care provider if:  You live in or have traveled to an area where COVID-19 is a risk and you have symptoms of the infection.  You have had contact with someone who has COVID-19 and you have symptoms of the infection. Get help right away if:  You have trouble breathing.  You have pain or pressure in your chest.  You have confusion.  You have bluish lips and fingernails.  You have difficulty waking from sleep.  You have symptoms that get worse. These symptoms may represent a serious problem that is an emergency. Do not wait to see if the symptoms will go away. Get medical help right away. Call your local emergency services (911 in the [place] Do not drive yourself to the hospital. Let the emergency medical personnel know if you think you have COVID-19. Summary  COVID-19 is a  respiratory infection that is caused by a virus. It is also known as coronavirus disease or novel coronavirus. It can cause serious infections, such as pneumonia, acute respiratory distress syndrome, acute respiratory failure, or sepsis.  The virus that causes COVID-19 is contagious. This means that it can spread from person to person through droplets from breathing, speaking, singing, coughing, or sneezing.  You are more likely to develop a serious illness if you are 50 years of age or older, have a weak immune system, live in a nursing home, or have chronic disease.  There is no medicine to treat COVID-19. Your health care provider will talk with you about ways to treat your symptoms.    Take steps to protect yourself and others from infection. Wash your hands often and disinfect objects and surfaces that are frequently touched every day. Stay away from people who are sick and wear a mask if you are sick. This information is not intended to replace advice given to you by your health care provider. Make sure you discuss any questions you have with your health care provider. Document Revised: 08/10/2019 Document Reviewed: 11/16/2018 Elsevier Patient Education  2020 Elsevier Inc.  

## 2020-01-15 NOTE — Telephone Encounter (Addendum)
Patient called stating that he tested positive for covid yesterday at [facility]. Patient stated that he is having chills, head congestion and soreness in his stomach. Patient denies any SOB or difficulty breathing. Patient scheduled for a virtual visit with Webb Silversmith NP today. ER precautions given to patient and he verbalized understanding. Patient stated that he is in quarantine for 10 days. Patient was advised to rest, drink plenty of fluids and eat well-balanced meals.  See patient's upcoming appointment.

## 2020-01-16 ENCOUNTER — Telehealth: Payer: Self-pay | Admitting: *Deleted

## 2020-01-16 NOTE — Telephone Encounter (Signed)
Patient left a voicemail stating that he has covid 19 and takes Pantoprazole. Patient wants to know if it is safe for him to take that medication since he has covid?

## 2020-01-16 NOTE — Telephone Encounter (Signed)
Pt wants to know since started diarrhea this morning is that a good or bad thing; diarrhea is not watery; stool is soft; no N&V. Advised pt that diarrhea is a symptom of covid and a concern would be if diarrhea became watery or N&V for period of time could cause dehydration and pt voiced understanding. Advised pt if develops fever or body aches can take Tylenol and for pt to get plenty of rest. Pt voiced understanding and request cb after R Baity NP reviews note from earlier this morning and this note as well. Pt had virtual visit with Avie Echevaria NP on 01/15/20.

## 2020-01-16 NOTE — Telephone Encounter (Signed)
Yes, continue the Pantoprazole. Agree with advice given

## 2020-01-16 NOTE — Telephone Encounter (Signed)
Pt is aware and expressed understanding

## 2020-01-18 ENCOUNTER — Ambulatory Visit: Payer: Commercial Managed Care - PPO | Admitting: Internal Medicine

## 2020-01-21 ENCOUNTER — Telehealth: Payer: Self-pay | Admitting: Internal Medicine

## 2020-01-21 NOTE — Telephone Encounter (Signed)
Diarrhea can occur with covid. He should consume a bland diet, making sure he is getting enough fluids. He should wash his hands frequently. Would avoid antidiarrheals like Imodium unless he is having multiple loose stools per day.

## 2020-01-21 NOTE — Telephone Encounter (Signed)
Pt called stating he was dx with covid on 3/22 he is on day 10 of quarantine.  He stated he is started to have diarrhea now and wanted to know if the is normal  He has chills  Loss of smell/taste  Pt stated he is starting to have anxiety Offered appointment today  Pt declined  Please advise

## 2020-01-21 NOTE — Telephone Encounter (Signed)
Called pt, no answer and VM not set up 

## 2020-01-22 NOTE — Telephone Encounter (Signed)
[place] Night - Client Nonclinical Telephone Record AccessNurse Client [facility] Night - Client Client Site [place] Physician Webb Silversmith - NP Contact Type Call Who Is Calling Patient / Member / Family / Caregiver Caller Name [name] Phone Number [phone] Call Type Message Only Information Provided Reason for Call Returning a Call from the Office Initial [name] states he is returning a call to the office. Additional Comment Provided caller with office hours from profile. Disp. Time Disposition Final User 01/21/2020 5:19:40 PM General Information Provided Yes Hassie Bruce Call Closed By: Hassie Bruce Transaction Date/Time: 01/21/2020 5:17:26 PM (ET)

## 2020-01-23 ENCOUNTER — Telehealth: Payer: Self-pay

## 2020-01-23 ENCOUNTER — Emergency Department: Payer: Commercial Managed Care - PPO

## 2020-01-23 ENCOUNTER — Other Ambulatory Visit: Payer: Self-pay

## 2020-01-23 ENCOUNTER — Emergency Department
Admission: EM | Admit: 2020-01-23 | Discharge: 2020-01-23 | Disposition: A | Discharge status: Home / Self Care | Payer: Commercial Managed Care - PPO | Attending: Emergency Medicine | Admitting: Emergency Medicine

## 2020-01-23 DIAGNOSIS — Z79899 Other long term (current) drug therapy: Secondary | ICD-10-CM | POA: Insufficient documentation

## 2020-01-23 DIAGNOSIS — F1210 Cannabis abuse, uncomplicated: Secondary | ICD-10-CM | POA: Diagnosis not present

## 2020-01-23 DIAGNOSIS — F17210 Nicotine dependence, cigarettes, uncomplicated: Secondary | ICD-10-CM | POA: Diagnosis not present

## 2020-01-23 DIAGNOSIS — B349 Viral infection, unspecified: Secondary | ICD-10-CM

## 2020-01-23 DIAGNOSIS — M791 Myalgia, unspecified site: Secondary | ICD-10-CM | POA: Diagnosis present

## 2020-01-23 MED ORDER — KETOROLAC TROMETHAMINE 60 MG/2ML IM SOLN
60.0000 mg | Freq: Once | INTRAMUSCULAR | Status: AC
  Administered 2020-01-23: 60 mg via INTRAMUSCULAR
  Filled 2020-01-23: qty 2

## 2020-01-23 MED ORDER — ONDANSETRON HCL 8 MG PO TABS: 8.0000 mg | ORAL_TABLET | Freq: Three times a day (TID) | ORAL | 0 refills | Status: DC | PRN

## 2020-01-23 MED ORDER — CYCLOBENZAPRINE HCL 10 MG PO TABS: 10.0000 mg | ORAL_TABLET | Freq: Three times a day (TID) | ORAL | 0 refills | Status: DC | PRN

## 2020-01-23 MED ORDER — ONDANSETRON 8 MG PO TBDP
8.0000 mg | ORAL_TABLET | Freq: Once | ORAL | Status: AC
  Administered 2020-01-23: 8 mg via ORAL
  Filled 2020-01-23: qty 1

## 2020-01-23 MED ORDER — KETOROLAC TROMETHAMINE 10 MG PO TABS: 10.0000 mg | ORAL_TABLET | Freq: Four times a day (QID) | ORAL | 0 refills | Status: DC | PRN

## 2020-01-23 NOTE — ED Triage Notes (Signed)
Pt comes POV with covid dx last Monday. Pt c/o nausea without vomiting and headaches, body aches, and fatigue. Pt AOx4.

## 2020-01-23 NOTE — Telephone Encounter (Signed)
Noted, likely more anxiety than anything.

## 2020-01-23 NOTE — Telephone Encounter (Signed)
Pt states tested positive for covid on 01/14/20 at [facility] in [place].this morning pt is feeling anxious, No CP  But pt is nauseated, pt's weakness in both legs that pt has had for awhile has worsened this morning; pt has H/A with pain level of 5; pt having chills but no fever and earlier this morning pt felt like was loosing control; pt feels tired this morning and having SOB on and off this morning. Had diarrhea earlier this wk. Pt does not have BP cuff. Pt is very concerned and is going to [facility] ED now; pt did not want to go to UC. FYI to Avie Echevaria NP.

## 2020-01-23 NOTE — ED Provider Notes (Signed)
[facility] Emergency Department Provider Note   ____________________________________________   First MD Initiated Contact with Patient 01/23/20 1214     (approximate)  I have reviewed the triage vital signs and the nursing notes.   HISTORY  Chief Complaint Generalized Body Aches    HPI Donald Austin is a 59 y.o. male patient complaining of generalized body aches with nausea, headache, and fatigue.  Patient denies URI signs or symptoms.  Patient denies vomiting.  Patient was diagnosed last week with COVID-19 and states this is ninth day of quarantine.  No palliative measures for complaint.         Past Medical History:  Diagnosis Date  . Anemia    NOS  . GERD (gastroesophageal reflux disease)   . History of Helicobacter pylori infection     Patient Active Problem List   Diagnosis Date Noted  . Erectile dysfunction 07/13/2018  . ANEMIA-NOS 12/25/2010  . Peripheral vascular disease ([place]) 06/09/2007  . GERD 06/09/2007    Past Surgical History:  Procedure Laterality Date  . COLONOSCOPY  2012  . fatty tissue removed from neck  2004    Prior to Admission medications   Medication Sig Start Date End Date Taking? Authorizing Provider  cyclobenzaprine (FLEXERIL) 10 MG tablet Take 1 tablet (10 mg total) by mouth 3 (three) times daily as needed. 01/23/20   Sable Feil, PA-C  ketorolac (TORADOL) 10 MG tablet Take 1 tablet (10 mg total) by mouth every 6 (six) hours as needed. 01/23/20   Sable Feil, PA-C  ondansetron (ZOFRAN) 8 MG tablet Take 1 tablet (8 mg total) by mouth every 8 (eight) hours as needed for nausea or vomiting. 01/23/20   Sable Feil, PA-C  pantoprazole (PROTONIX) 40 MG tablet Take 1 tablet (40 mg total) by mouth 2 (two) times daily before a meal. 09/13/19   Cook, Barnie Del, DO  tadalafil (CIALIS) 20 MG tablet TAKE 1 TABLET BY MOUTH AS NEEDED 01/09/20   McKenzie, Candee Furbish, MD    Allergies Patient has no known  allergies.  Family History  Problem Relation Age of Onset  . Hypertension Father   . Kidney failure Father   . Hypertension Mother   . Cancer Sister        breast  . Hypertension Sister   . Hypertension Sister   . Hypertension Brother   . Hypertension Brother   . Heart attack Neg Hx        <55    Social History Social History   Tobacco Use  . Smoking status: Current Every Day Smoker    Packs/day: 1.00    Types: Cigarettes  . Smokeless tobacco: Never Used  . Tobacco comment: quit 4 days ago, 20pyh  Substance Use Topics  . Alcohol use: Yes    Alcohol/week: 4.0 standard drinks    Types: 4 Cans of beer per week  . Drug use: Not Currently    Types: Marijuana    Review of Systems  Constitutional: No fever/chills.  Fatigue and body aches Eyes: No visual changes. ENT: No sore throat. Cardiovascular: Denies chest pain. Respiratory: Denies shortness of breath. Gastrointestinal: No abdominal pain.  Nausea without vomiting.  No diarrhea.  No constipation. Genitourinary: Negative for dysuria. Musculoskeletal: Negative for back pain. Skin: Negative for rash. Neurological: Negative for headaches, focal weakness or numbness.   ____________________________________________   PHYSICAL EXAM:  VITAL SIGNS: ED Triage Vitals  Enc Vitals Group     BP 01/23/20 1212  127/87     Pulse Rate 01/23/20 1210 79     Resp 01/23/20 1210 18     Temp 01/23/20 1210 98.5 F (36.9 C)     Temp Source 01/23/20 1210 Oral     SpO2 01/23/20 1210 96 %     Weight 01/23/20 1211 180 lb (81.6 kg)     Height 01/23/20 1211 6' (1.829 m)     Head Circumference --      Peak Flow --      Pain Score 01/23/20 1210 0     Pain Loc --      Pain Edu? --      Excl. in [place]? --     Constitutional: Alert and oriented. Well appearing and in no acute distress. Nose: No congestion/rhinnorhea. Mouth/Throat: Mucous membranes are moist.  Oropharynx non-erythematous. Neck: No stridor.   Hematological/Lymphatic/Immunilogical: No cervical lymphadenopathy. Cardiovascular: Normal rate, regular rhythm. Grossly normal heart sounds.  Good peripheral circulation. Respiratory: Normal respiratory effort.  No retractions. Lungs CTAB. Gastrointestinal: Soft and nontender. No distention. No abdominal bruits. No CVA tenderness. Neurologic:  Normal speech and language. No gross focal neurologic deficits are appreciated. No gait instability. Skin:  Skin is warm, dry and intact. No rash noted. Psychiatric: Mood and affect are normal. Speech and behavior are normal.  ____________________________________________   LABS (all labs ordered are listed, but only abnormal results are displayed)  Labs Reviewed - No data to display ____________________________________________  EKG   ____________________________________________  RADIOLOGY  ED MD interpretation:    Official radiology report(s): DG Chest Portable 1 View  Result Date: 01/23/2020 CLINICAL DATA:  Fatigue and cough status post ninth day of COVID-19. Additional history provided: Patient diagnosed with COVID-19 9 days ago, nausea without vomiting and headaches, body aches, fatigue. Current everyday smoker. EXAM: PORTABLE CHEST 1 VIEW COMPARISON:  Chest radiograph 05/02/2018 FINDINGS: Heart size within normal limits. There is no evidence of airspace consolidation within the lungs. No evidence of pleural effusion or pneumothorax. No acute bony abnormality. IMPRESSION: No evidence of acute cardiopulmonary abnormality. Electronically Signed   By: Kellie Simmering DO   On: 01/23/2020 12:54    ____________________________________________   PROCEDURES  Procedure(s) performed (including Critical Care):  Procedures   ____________________________________________   INITIAL IMPRESSION / ASSESSMENT AND PLAN / ED COURSE  As part of my medical decision making, I reviewed the following data within the [place]   Patient  complaint generalized body aches and nausea secondary to to positive diagnosed with COVID-19.  Patient is self quarantine.  Patient today was the worst day of his quarantine.  Discussed negative chest x-ray findings with patient.  His exam is consistent with viral illness.  Discussed sequela of viral illness.  Patient advised continue self quarantine and take medication as directed.       DEONTREY MAISH was evaluated in Emergency Department on 01/23/2020 for the symptoms described in the history of present illness. He was evaluated in the context of the global COVID-19 pandemic, which necessitated consideration that the patient might be at risk for infection with the SARS-CoV-2 virus that causes COVID-19. Institutional protocols and algorithms that pertain to the evaluation of patients at risk for COVID-19 are in a state of rapid change based on information released by regulatory bodies including the CDC and federal and state organizations. These policies and algorithms were followed during the patient's care in the ED.       ____________________________________________   FINAL CLINICAL IMPRESSION(S) / ED DIAGNOSES  Final diagnoses:  Viral illness     ED Discharge Orders         Ordered    ketorolac (TORADOL) 10 MG tablet  Every 6 hours PRN     01/23/20 1334    ondansetron (ZOFRAN) 8 MG tablet  Every 8 hours PRN     01/23/20 1334    cyclobenzaprine (FLEXERIL) 10 MG tablet  3 times daily PRN     01/23/20 1334           Note:  This document was prepared using Dragon voice recognition software and may include unintentional dictation errors.    Sable Feil, PA-C 01/23/20 1341    Delman Kitten, MD 01/23/20 [phone]

## 2020-01-23 NOTE — ED Notes (Signed)
See triage note. Pt here for covid symptoms. States that he has been in quarantine since last Monday but he feels worse today with nausea and weakness.

## 2020-01-23 NOTE — Discharge Instructions (Addendum)
Follow discharge care instruction, continue self quarantine, take medication as directed.

## 2020-02-18 ENCOUNTER — Telehealth: Payer: Self-pay

## 2020-02-18 NOTE — Telephone Encounter (Signed)
[facility] - Client TELEPHONE ADVICE RECORD [facility]urse Patient Name: [name] Gender: Male DOB: [birth year] Age: 59 Y 21 D Return Phone Number: [phone] (Primary) Address: City/State/Zip: [place] [address] Client [facility] - Client Client Site [place] - Day Physician Webb Silversmith - NP Contact Type Call Who Is Calling Patient / Member / Family / Caregiver Call Type Triage / Clinical Relationship To Patient Self Return Phone Number [phone] (Primary) Chief Complaint Weakness, Generalized Reason for Call Symptomatic / Request for [place] states he is having a lot of weakness especially in the morning and has chills. Caller states he is able to move around but feels very tired and is unsure if he has a fever. Translation No Nurse Assessment Guidelines Guideline Title Affirmed Question Affirmed Notes Nurse Date/Time (Eastern Time) Disp. Time Eilene Ghazi Time) Disposition Final User 02/15/2020 5:06:47 PM Attempt made - no message left Lorenz Coaster Angelica [id number] PM Attempt made - no message left Carlena Bjornstad, RN, Verdis Frederickson Angelica [id number] PM FINAL ATTEMPT MADE - no message left

## 2020-02-18 NOTE — Telephone Encounter (Signed)
No answer and VM not set up yet.

## 2020-02-19 NOTE — Telephone Encounter (Signed)
noted 

## 2020-02-19 NOTE — Telephone Encounter (Signed)
No answer and no VM

## 2020-04-06 IMAGING — DX DG CHEST 1V PORT
2 series · 2 of 2 positions shown · non-contrast
Comparison: Chest radiograph 05/02/2018

CLINICAL DATA: Fatigue and cough status post ninth day of 8GB6J-PW.
Additional history provided: Patient diagnosed with 8GB6J-PW 9 days
ago, nausea without vomiting and headaches, body aches, fatigue.
Current everyday smoker.

EXAM:
PORTABLE CHEST 1 VIEW

[chest ap (1 of 2)]
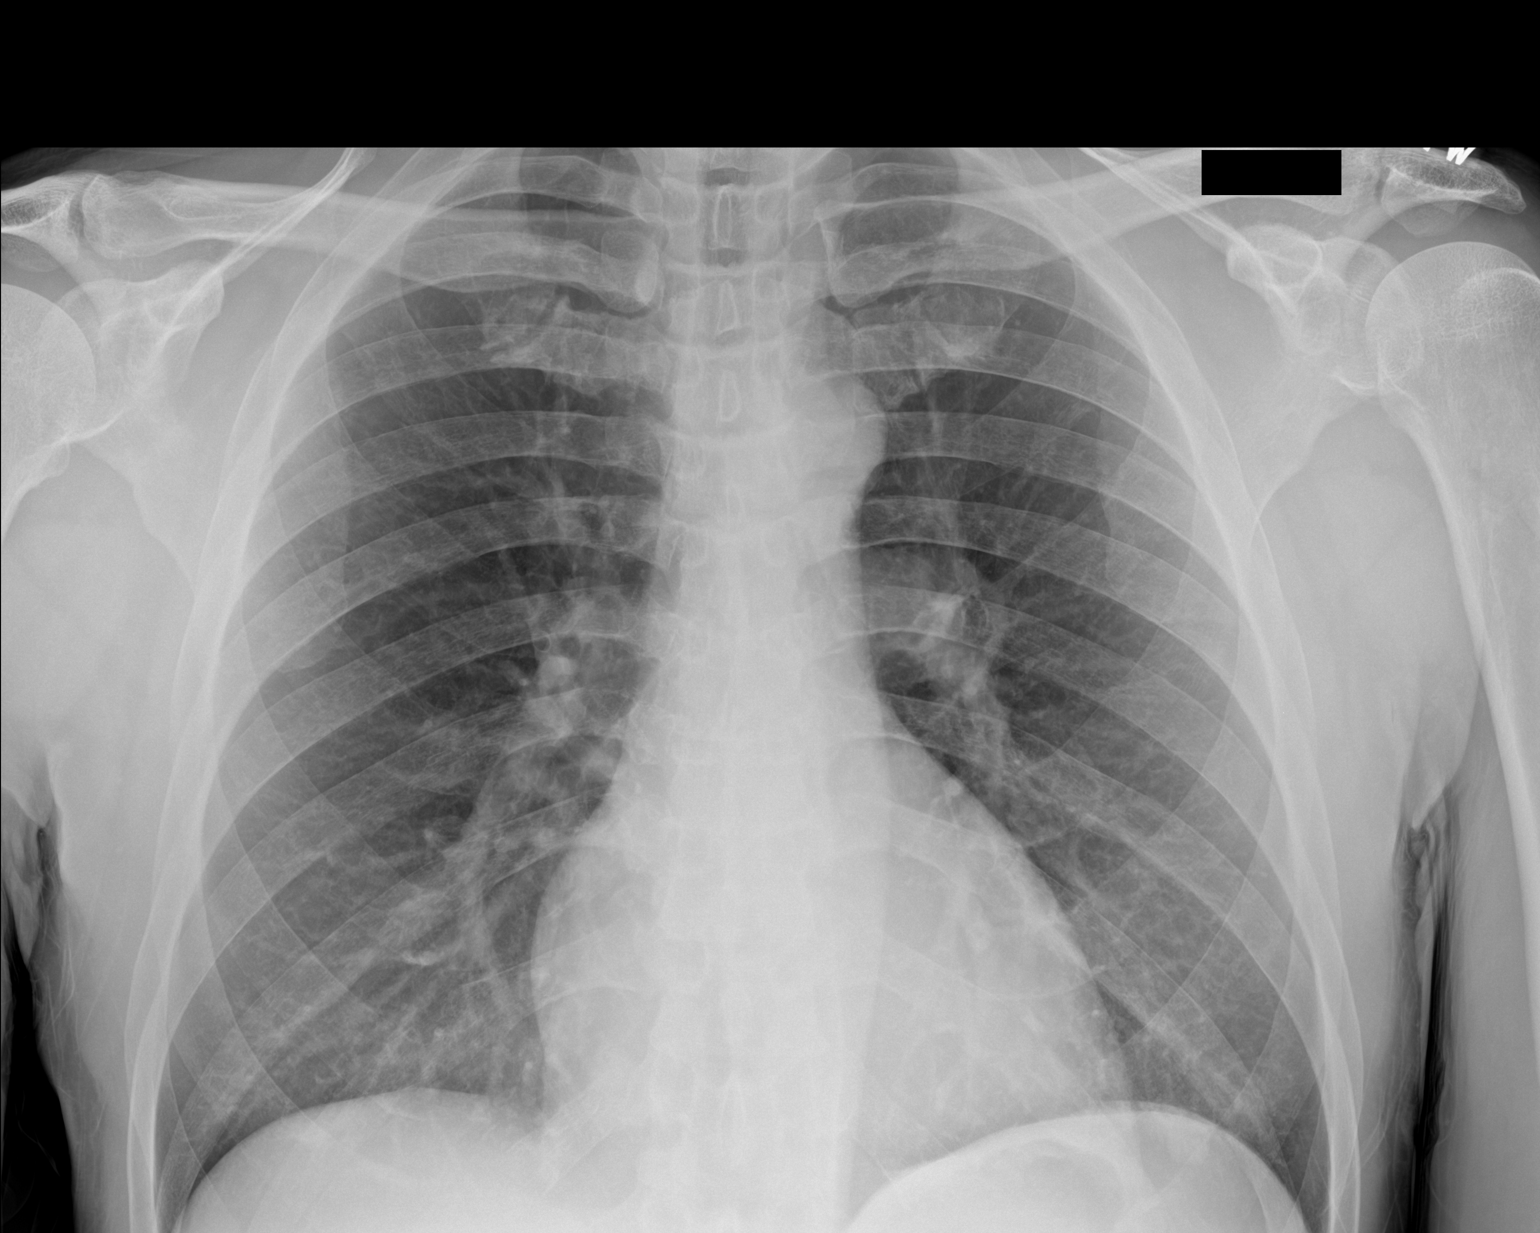

[chest ap (2 of 2)]
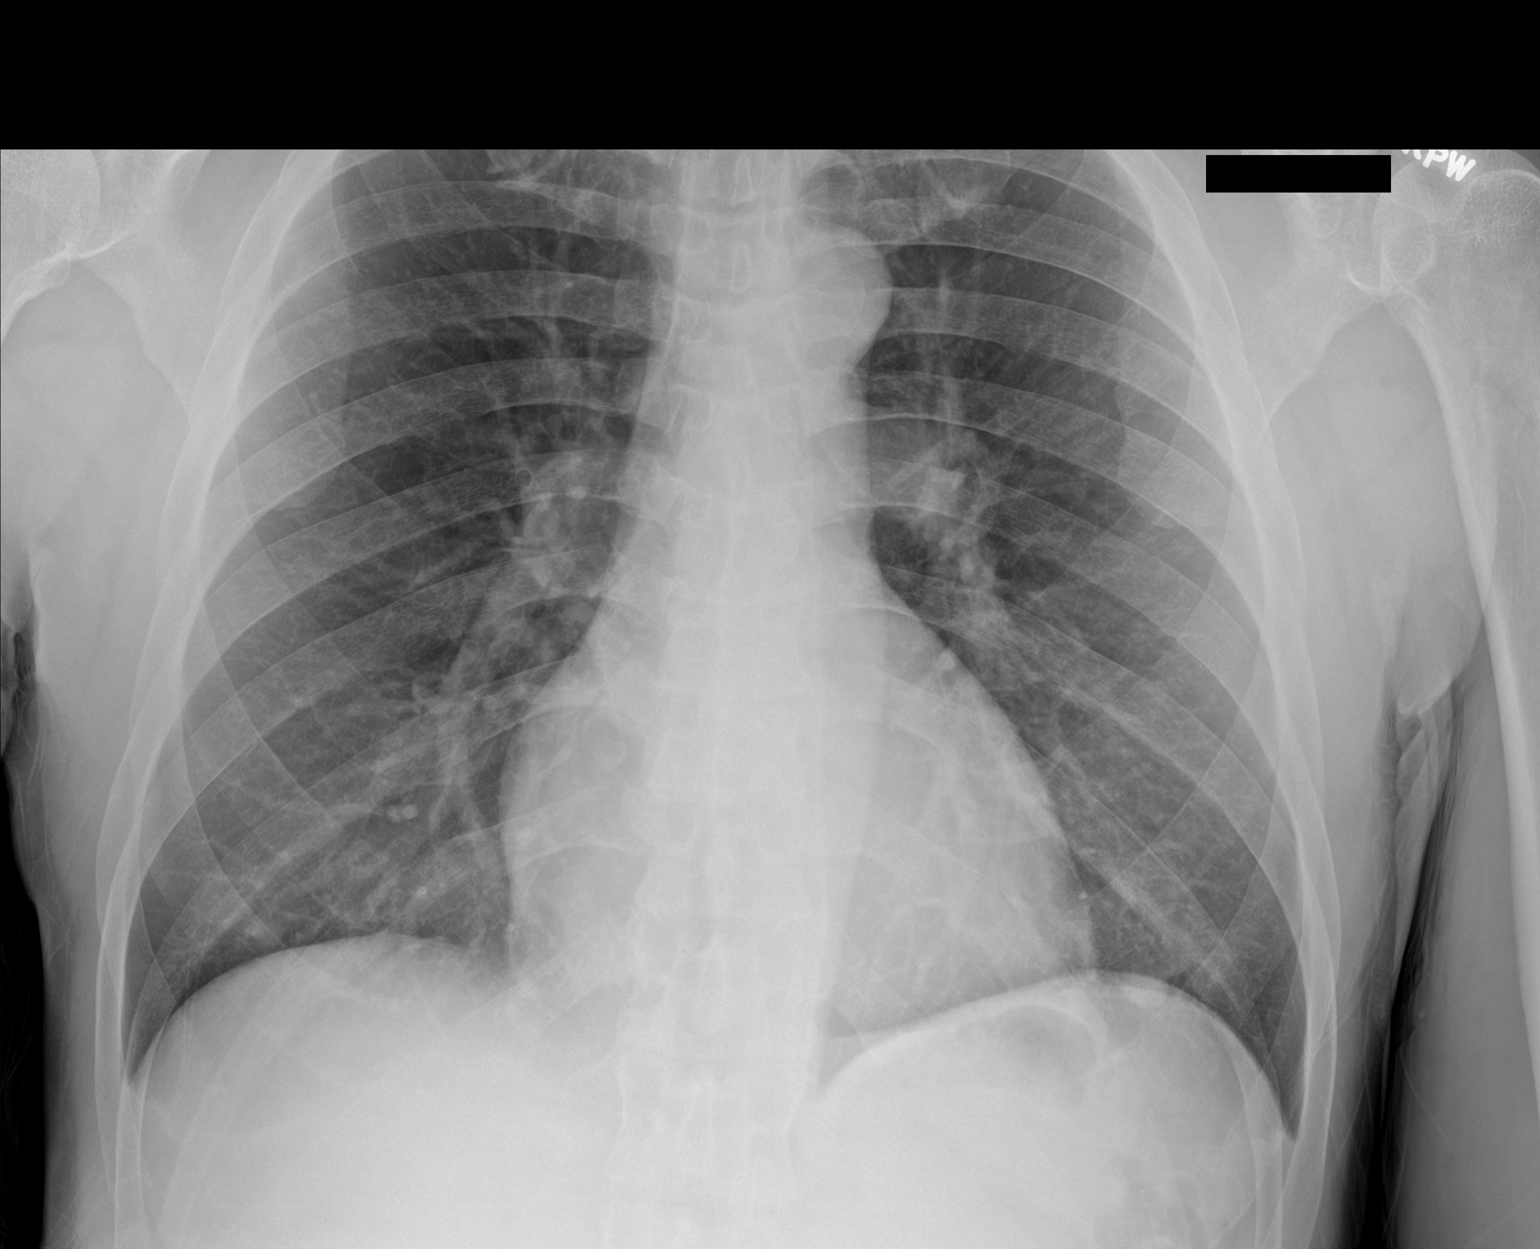

[2 of 2 positions shown; findings below may reference images not displayed]

FINDINGS: Heart size within normal limits.

There is no evidence of airspace consolidation within the lungs.

No evidence of pleural effusion or pneumothorax.

No acute bony abnormality.
IMPRESSION: No evidence of acute cardiopulmonary abnormality.

## 2020-04-09 ENCOUNTER — Ambulatory Visit: Payer: Commercial Managed Care - PPO | Admitting: Internal Medicine

## 2020-04-09 DIAGNOSIS — Z0289 Encounter for other administrative examinations: Secondary | ICD-10-CM

## 2020-04-14 ENCOUNTER — Ambulatory Visit: Payer: Commercial Managed Care - PPO | Admitting: Internal Medicine

## 2020-04-14 DIAGNOSIS — Z0289 Encounter for other administrative examinations: Secondary | ICD-10-CM

## 2020-04-14 NOTE — Progress Notes (Deleted)
Subjective:    Patient ID: Donald Austin, male    DOB: 02-Oct-1961, 59 y.o.   MRN: [id number]  HPI  Pt presents to the clinic today with c/o back pain. This started. He describes the pain as.  Review of Systems      Past Medical History:  Diagnosis Date   Anemia    NOS   GERD (gastroesophageal reflux disease)    History of Helicobacter pylori infection     Current Outpatient Medications  Medication Sig Dispense Refill   cyclobenzaprine (FLEXERIL) 10 MG tablet Take 1 tablet (10 mg total) by mouth 3 (three) times daily as needed. 15 tablet 0   ketorolac (TORADOL) 10 MG tablet Take 1 tablet (10 mg total) by mouth every 6 (six) hours as needed. 20 tablet 0   ondansetron (ZOFRAN) 8 MG tablet Take 1 tablet (8 mg total) by mouth every 8 (eight) hours as needed for nausea or vomiting. 20 tablet 0   pantoprazole (PROTONIX) 40 MG tablet Take 1 tablet (40 mg total) by mouth 2 (two) times daily before a meal. 60 tablet 3   tadalafil (CIALIS) 20 MG tablet TAKE 1 TABLET BY MOUTH AS NEEDED 10 tablet 4   No current facility-administered medications for this visit.    No Known Allergies  Family History  Problem Relation Age of Onset   Hypertension Father    Kidney failure Father    Hypertension Mother    Cancer Sister        breast   Hypertension Sister    Hypertension Sister    Hypertension Brother    Hypertension Brother    Heart attack Neg Hx        <55    Social History   Socioeconomic History   Marital status: Married    Spouse name: Not on file   Number of children: 3   Years of education: Not on file   Highest education level: Not on file  Occupational History   Occupation: Ecologist    Comment: [employer]  Tobacco Use   Smoking status: Current Every Day Smoker    Packs/day: 1.00    Types: Cigarettes   Smokeless tobacco: Never Used   Tobacco comment: quit 4 days ago, 20pyh  Vaping Use   Vaping Use: Former  Substance and  Sexual Activity   Alcohol use: Yes    Alcohol/week: 4.0 standard drinks    Types: 4 Cans of beer per week   Drug use: Not Currently    Types: Marijuana   Sexual activity: Not on file  Other Topics Concern   Not on file  Social History Narrative   Regular exercise--no   Children 3 healthy   Diet: Avoids fried food, baked, occ. Fruits and veggies   Social Determinants of Health   Financial Resource Strain:    Difficulty of Paying Living Expenses:   Food Insecurity:    Worried About Charity fundraiser in the Last Year:    Arboriculturist in the Last Year:   Transportation Needs:    Film/video editor (Medical):    Lack of Transportation (Non-Medical):   Physical Activity:    Days of Exercise per Week:    Minutes of Exercise per Session:   Stress:    Feeling of Stress :   Social Connections:    Frequency of Communication with Friends and Family:    Frequency of Social Gatherings with Friends and Family:    Attends Religious Services:  Active Member of Clubs or Organizations:    Attends Music therapist:    Marital Status:   Intimate Partner Violence:    Fear of Current or Ex-Partner:    Emotionally Abused:    Physically Abused:    Sexually Abused:      Constitutional: Denies fever, malaise, fatigue, headache or abrupt weight changes.  HEENT: Denies eye pain, eye redness, ear pain, ringing in the ears, wax buildup, runny nose, nasal congestion, bloody nose, or sore throat. Respiratory: Denies difficulty breathing, shortness of breath, cough or sputum production.   Cardiovascular: Denies chest pain, chest tightness, palpitations or swelling in the hands or feet.  Gastrointestinal: Denies abdominal pain, bloating, constipation, diarrhea or blood in the stool.  GU: Denies urgency, frequency, pain with urination, burning sensation, blood in urine, odor or discharge. Musculoskeletal: Pt reports back pain. Denies decrease in range of  motion, difficulty with gait, muscle pain or joint swelling.  Skin: Denies redness, rashes, lesions or ulcercations.  Neurological: Denies dizziness, difficulty with memory, difficulty with speech or problems with balance and coordination.  Psych: Denies anxiety, depression, SI/HI.  No other specific complaints in a complete review of systems (except as listed in HPI above).  Objective:   Physical Exam   There were no vitals taken for this visit. Wt Readings from Last 3 Encounters:  01/23/20 180 lb (81.6 kg)  11/10/19 190 lb (86.2 kg)  09/13/19 195 lb (88.5 kg)    General: Appears their stated age, well developed, well nourished in NAD. Skin: Warm, dry and intact. No rashes, lesions or ulcerations noted. HEENT: Head: normal shape and size; Eyes: sclera white, no icterus, conjunctiva pink, PERRLA and EOMs intact; Ears: Tm's gray and intact, normal light reflex; Nose: mucosa pink and moist, septum midline; Throat/Mouth: Teeth present, mucosa pink and moist, no exudate, lesions or ulcerations noted.  Neck:  Neck supple, trachea midline. No masses, lumps or thyromegaly present.  Cardiovascular: Normal rate and rhythm. S1,S2 noted.  No murmur, rubs or gallops noted. No JVD or BLE edema. No carotid bruits noted. Pulmonary/Chest: Normal effort and positive vesicular breath sounds. No respiratory distress. No wheezes, rales or ronchi noted.  Abdomen: Soft and nontender. Normal bowel sounds. No distention or masses noted. Liver, spleen and kidneys non palpable. Musculoskeletal: Normal range of motion. No signs of joint swelling. No difficulty with gait.  Neurological: Alert and oriented. Cranial nerves II-XII grossly intact. Coordination normal.  Psychiatric: Mood and affect normal. Behavior is normal. Judgment and thought content normal.   EKG:  BMET    Component Value Date/Time   NA 138 03/02/2019 1032   K 4.0 03/02/2019 1032   CL 102 03/02/2019 1032   CO2 30 03/02/2019 1032   GLUCOSE  98 03/02/2019 1032   BUN 15 03/02/2019 1032   CREATININE 1.04 03/02/2019 1032   CALCIUM 9.2 03/02/2019 1032   GFRNONAA >60 05/01/2018 2221   GFRAA >60 05/01/2018 2221    Lipid Panel     Component Value Date/Time   CHOL 171 09/12/2018 1004   TRIG 75.0 09/12/2018 1004   HDL 33.90 (L) 09/12/2018 1004   CHOLHDL 5 09/12/2018 1004   VLDL 15.0 09/12/2018 1004   LDLCALC 122 (H) 09/12/2018 1004    CBC    Component Value Date/Time   WBC 6.3 03/02/2019 1032   RBC 5.19 03/02/2019 1032   HGB 15.9 03/02/2019 1032   HCT 46.3 03/02/2019 1032   PLT 233.0 03/02/2019 1032   MCV 89.2 03/02/2019 1032  MCH 30.0 05/01/2018 2221   MCHC 34.3 03/02/2019 1032   RDW 13.4 03/02/2019 1032   LYMPHSABS 2.5 12/25/2010 1643   MONOABS 0.7 12/25/2010 1643   EOSABS 0.1 12/25/2010 1643   BASOSABS 0.0 12/25/2010 1643    Hgb A1C No results found for: HGBA1C         Assessment & Plan:   Webb Silversmith, NP This visit occurred during the SARS-CoV-2 public health emergency.  Safety protocols were in place, including screening questions prior to the visit, additional usage of staff PPE, and extensive cleaning of exam room while observing appropriate contact time as indicated for disinfecting solutions.

## 2020-05-29 ENCOUNTER — Telehealth: Payer: Self-pay

## 2020-05-29 NOTE — Telephone Encounter (Signed)
[place] Night - Client Nonclinical Telephone Record [facility]urse Client [facility] Night - Client Client Site [place] Physician Webb Silversmith - NP Contact Type Call Who Is Calling Patient / Member / Family / Caregiver Caller Name [place] Phone Number [phone] Patient Name Donald Austin Patient DOB 04-12-1961 Call Type Message Only Information Provided Reason for Call Request to Schedule Office Appointment Initial Comment Caller states, needs an appt. has problems with his neck and spine. Needs advise, causing numbness in left arm. Disp. Time Disposition Final User 05/28/2020 6:55:51 PM General Information Provided Yes Jobie Quaker Call Closed By: Jobie Quaker Transaction Date/Time: 05/28/2020 6:51:51 PM (ET)

## 2020-05-29 NOTE — Telephone Encounter (Signed)
Left v/m requesting pt to call [facility] [phone] option 4 for triage. 06/02/20 at 11:15 blocked for 30' appt if pt can wait to see Avie Echevaria NP.

## 2020-06-05 ENCOUNTER — Other Ambulatory Visit: Payer: Self-pay

## 2020-06-05 ENCOUNTER — Ambulatory Visit (INDEPENDENT_AMBULATORY_CARE_PROVIDER_SITE_OTHER): Payer: Commercial Managed Care - PPO | Admitting: Internal Medicine

## 2020-06-05 ENCOUNTER — Encounter: Payer: Self-pay | Admitting: Internal Medicine

## 2020-06-05 VITALS — BP 122/88 | HR 68 | Temp 98.2°F | Wt 190.0 lb

## 2020-06-05 DIAGNOSIS — M5412 Radiculopathy, cervical region: Secondary | ICD-10-CM | POA: Diagnosis not present

## 2020-06-05 DIAGNOSIS — M5416 Radiculopathy, lumbar region: Secondary | ICD-10-CM

## 2020-06-05 MED ORDER — PREDNISONE 10 MG PO TABS: ORAL_TABLET | ORAL | 0 refills | Status: DC

## 2020-06-05 MED ORDER — METHOCARBAMOL 500 MG PO TABS: 500.0000 mg | ORAL_TABLET | Freq: Three times a day (TID) | ORAL | 0 refills | Status: DC | PRN

## 2020-06-05 NOTE — Progress Notes (Signed)
Subjective:    Patient ID: Donald Austin, male    DOB: Dec 08, 1960, 59 y.o.   MRN: [id number]  HPI  Pt presents to the clinic today with c/o neck pain. This started about 2 weeks.  He describes the neck pain as tightness. He reports associated  "popping" of the bones in his neck. The pain does not radiate but he has numbness and tingling in his right hand. The pain is worse with certain movements. CT cervical spine from 08/2018 showed:  IMPRESSION: 1. No CT abnormality to correspond to the mildly abnormal marrow signal in the C5 vertebral body last month. This strongly favors benign etiology and significance is doubtful. Repeat noncontrast cervical spine MRI could be used to document stability (e.g. in 6 months). 2. Persistent C6-C7 disc herniation, better characterized on the comparison MRI.  He also reports low back pain. This has been a chronic issue. He describes the pain as stiff and achy. The pain is worse in the morning, gets better throughout the day. He reports some associated numbness in the left leg but denies tingling or weakness. He denies loss of bowel or bladder control. He has not taken anything for this but has Toradol and Flexeril on his medication.   MRI from 07/2018 showed:  IMPRESSION: Severe spinal stenosis at L3-4 L4-5 with progression since 2011. Stenosis most severe at L4-5. Mild spinal stenosis L2-3 with left lateral small disc protrusion.  He has seen neurosurgery in the past, Dr. Arnette Felts.  Review of Systems      Past Medical History:  Diagnosis Date  . Anemia    NOS  . GERD (gastroesophageal reflux disease)   . History of Helicobacter pylori infection     Current Outpatient Medications  Medication Sig Dispense Refill  . cyclobenzaprine (FLEXERIL) 10 MG tablet Take 1 tablet (10 mg total) by mouth 3 (three) times daily as needed. 15 tablet 0  . ketorolac (TORADOL) 10 MG tablet Take 1 tablet (10 mg total) by mouth every 6 (six) hours as needed.  20 tablet 0  . ondansetron (ZOFRAN) 8 MG tablet Take 1 tablet (8 mg total) by mouth every 8 (eight) hours as needed for nausea or vomiting. 20 tablet 0  . pantoprazole (PROTONIX) 40 MG tablet Take 1 tablet (40 mg total) by mouth 2 (two) times daily before a meal. 60 tablet 3  . tadalafil (CIALIS) 20 MG tablet TAKE 1 TABLET BY MOUTH AS NEEDED 10 tablet 4   No current facility-administered medications for this visit.    No Known Allergies  Family History  Problem Relation Age of Onset  . Hypertension Father   . Kidney failure Father   . Hypertension Mother   . Cancer Sister        breast  . Hypertension Sister   . Hypertension Sister   . Hypertension Brother   . Hypertension Brother   . Heart attack Neg Hx        <55    Social History   Socioeconomic History  . Marital status: Married    Spouse name: Not on file  . Number of children: 3  . Years of education: Not on file  . Highest education level: Not on file  Occupational History  . Occupation: Ecologist    Comment: [employer]  Tobacco Use  . Smoking status: Current Every Day Smoker    Packs/day: 1.00    Types: Cigarettes  . Smokeless tobacco: Never Used  . Tobacco comment: quit 4 days  ago, 20pyh  Vaping Use  . Vaping Use: Former  Substance and Sexual Activity  . Alcohol use: Yes    Alcohol/week: 4.0 standard drinks    Types: 4 Cans of beer per week  . Drug use: Not Currently    Types: Marijuana  . Sexual activity: Not on file  Other Topics Concern  . Not on file  Social History Narrative   Regular exercise--no   Children 3 healthy   Diet: Avoids fried food, baked, occ. Fruits and veggies   Social Determinants of Health   Financial Resource Strain:   . Difficulty of Paying Living Expenses:   Food Insecurity:   . Worried About Charity fundraiser in the Last Year:   . Arboriculturist in the Last Year:   Transportation Needs:   . Film/video editor (Medical):   Marland Kitchen Lack of Transportation  (Non-Medical):   Physical Activity:   . Days of Exercise per Week:   . Minutes of Exercise per Session:   Stress:   . Feeling of Stress :   Social Connections:   . Frequency of Communication with Friends and Family:   . Frequency of Social Gatherings with Friends and Family:   . Attends Religious Services:   . Active Member of Clubs or Organizations:   . Attends Archivist Meetings:   Marland Kitchen Marital Status:   Intimate Partner Violence:   . Fear of Current or Ex-Partner:   . Emotionally Abused:   Marland Kitchen Physically Abused:   . Sexually Abused:      Constitutional: Denies fever, malaise, fatigue, headache or abrupt weight changes.  Respiratory: Denies difficulty breathing, shortness of breath, cough or sputum production.   Cardiovascular: Denies chest pain, chest tightness, palpitations or swelling in the hands or feet.  Musculoskeletal: Pt reports neck and back pain. Denies decrease in range of motion, difficulty with gait, muscle pain or joint swelling.  Skin: Denies redness, rashes, lesions or ulcercations.  Neurological: Pt reports numbness in right arm, left leg. Denies dizziness, difficulty with memory, difficulty with speech or problems with balance and coordination.    No other specific complaints in a complete review of systems (except as listed in HPI above).  Objective:   Physical Exam   BP 122/88 (BP Location: Left Arm, Patient Position: Sitting, Cuff Size: Normal)   Pulse 68   Temp 98.2 F (36.8 C) (Temporal)   Wt 190 lb (86.2 kg)   SpO2 97%   BMI 25.77 kg/m   Wt Readings from Last 3 Encounters:  01/23/20 180 lb (81.6 kg)  11/10/19 190 lb (86.2 kg)  09/13/19 195 lb (88.5 kg)    General: Appears his stated age, well developed, well nourished in NAD. Skin: Warm, dry and intact. No rashes noted. HEENT: Head: normal shape and size; Eyes: sclera white, no icterus, conjunctiva pink, PERRLA and EOMs intact; Cardiovascular: Normal rate and rhythm. S1,S2 noted.   No murmur, rubs or gallops noted. No JVD or BLE edema.Radial and pedal pulses 2+ bilaterally. Pulmonary/Chest: Normal effort and positive vesicular breath sounds. No respiratory distress. No wheezes, rales or ronchi noted.  Musculoskeletal: Normal flexion, extension, rotation and lateral bending. No bony tenderness noted over the spine. Strength 5/5 BUE. Strength 4/5 LLE, 5/5 RLE. Hand grips equal. No signs of joint swelling. No difficulty with gait.  Neurological: Alert and oriented. Positive SLR on the left. Coordination normal.   BMET    Component Value Date/Time   NA 138 03/02/2019 1032  K 4.0 03/02/2019 1032   CL 102 03/02/2019 1032   CO2 30 03/02/2019 1032   GLUCOSE 98 03/02/2019 1032   BUN 15 03/02/2019 1032   CREATININE 1.04 03/02/2019 1032   CALCIUM 9.2 03/02/2019 1032   GFRNONAA >60 05/01/2018 2221   GFRAA >60 05/01/2018 2221    Lipid Panel     Component Value Date/Time   CHOL 171 09/12/2018 1004   TRIG 75.0 09/12/2018 1004   HDL 33.90 (L) 09/12/2018 1004   CHOLHDL 5 09/12/2018 1004   VLDL 15.0 09/12/2018 1004   LDLCALC 122 (H) 09/12/2018 1004    CBC    Component Value Date/Time   WBC 6.3 03/02/2019 1032   RBC 5.19 03/02/2019 1032   HGB 15.9 03/02/2019 1032   HCT 46.3 03/02/2019 1032   PLT 233.0 03/02/2019 1032   MCV 89.2 03/02/2019 1032   MCH 30.0 05/01/2018 2221   MCHC 34.3 03/02/2019 1032   RDW 13.4 03/02/2019 1032   LYMPHSABS 2.5 12/25/2010 1643   MONOABS 0.7 12/25/2010 1643   EOSABS 0.1 12/25/2010 1643   BASOSABS 0.0 12/25/2010 1643    Hgb A1C No results found for: HGBA1C         Assessment & Plan:   Cervical Radiculitis, Chronic Lumbar Radiculopathy:  He reports that he does not have money for expensive interventions or surgery RX for Pred Taper x 9 days, avoid NSAID's OTC RX for Methocarbamol 500 mg TID prn- sedation caution given Encouraged ice, stretching and massage Referral to PT for further evaluation and treatment Consider  repeat MRI cervical and lumbar spines  Return precautions discussed Webb Silversmith, NP This visit occurred during the SARS-CoV-2 public health emergency.  Safety protocols were in place, including screening questions prior to the visit, additional usage of staff PPE, and extensive cleaning of exam room while observing appropriate contact time as indicated for disinfecting solutions.

## 2020-06-05 NOTE — Patient Instructions (Signed)
Radicular Pain Radicular pain is a type of pain that spreads from your back or neck along a spinal nerve. Spinal nerves are nerves that leave the spinal cord and go to the muscles. Radicular pain is sometimes called radiculopathy, radiculitis, or a pinched nerve. When you have this type of pain, you may also have weakness, numbness, or tingling in the area of your body that is supplied by the nerve. The pain may feel sharp and burning. Depending on which spinal nerve is affected, the pain may occur in the:  Neck area (cervical radicular pain). You may also feel pain, numbness, weakness, or tingling in the arms.  Mid-spine area (thoracic radicular pain). You would feel this pain in the back and chest. This type is rare.  Lower back area (lumbar radicular pain). You would feel this pain as low back pain. You may feel pain, numbness, weakness, or tingling in the buttocks or legs. Sciatica is a type of lumbar radicular pain that shoots down the back of the leg. Radicular pain occurs when one of the spinal nerves becomes irritated or squeezed (compressed). It is often caused by something pushing on a spinal nerve, such as one of the bones of the spine (vertebrae) or one of the round cushions between vertebrae (intervertebral disks). This can result from:  An injury.  Wear and tear or aging of a disk.  The growth of a bone spur that pushes on the nerve. Radicular pain often goes away when you follow instructions from your health care provider for relieving pain at home. Follow these instructions at home: Managing pain      If directed, put ice on the affected area: ? Put ice in a plastic bag. ? Place a towel between your skin and the bag. ? Leave the ice on for 20 minutes, 2-3 times a day.  If directed, apply heat to the affected area as often as told by your health care provider. Use the heat source that your health care provider recommends, such as a moist heat pack or a heating pad. ? Place  a towel between your skin and the heat source. ? Leave the heat on for 20-30 minutes. ? Remove the heat if your skin turns bright red. This is especially important if you are unable to feel pain, heat, or cold. You may have a greater risk of getting burned. Activity   Do not sit or rest in bed for long periods of time.  Try to stay as active as possible. Ask your health care provider what type of exercise or activity is best for you.  Avoid activities that make your pain worse, such as bending and lifting.  Do not lift anything that is heavier than 10 lb (4.5 kg), or the limit that you are told, until your health care provider says that it is safe.  Practice using proper technique when lifting items. Proper lifting technique involves bending your knees and rising up.  Do strength and range-of-motion exercises only as told by your health care provider or physical therapist. General instructions  Take over-the-counter and prescription medicines only as told by your health care provider.  Pay attention to any changes in your symptoms.  Keep all follow-up visits as told by your health care provider. This is important. ? Your health care provider may send you to a physical therapist to help with this pain. Contact a health care provider if:  Your pain and other symptoms get worse.  Your pain medicine is not   helping.  Your pain has not improved after a few weeks of home care.  You have a fever. Get help right away if:  You have severe pain, weakness, or numbness.  You have difficulty with bladder or bowel control. Summary  Radicular pain is a type of pain that spreads from your back or neck along a spinal nerve.  When you have radicular pain, you may also have weakness, numbness, or tingling in the area of your body that is supplied by the nerve.  The pain may feel sharp or burning.  Radicular pain may be treated with ice, heat, medicines, or physical therapy. This  information is not intended to replace advice given to you by your health care provider. Make sure you discuss any questions you have with your health care provider. Document Revised: 04/25/2018 Document Reviewed: 04/25/2018 Elsevier Patient Education  2020 Elsevier Inc.  

## 2020-07-09 ENCOUNTER — Ambulatory Visit: Payer: Commercial Managed Care - PPO | Admitting: Physical Therapy

## 2020-07-14 ENCOUNTER — Ambulatory Visit: Payer: Commercial Managed Care - PPO | Admitting: Physical Therapy

## 2020-07-16 ENCOUNTER — Ambulatory Visit: Payer: Commercial Managed Care - PPO | Admitting: Physical Therapy

## 2020-07-22 ENCOUNTER — Ambulatory Visit: Payer: Commercial Managed Care - PPO | Admitting: Physical Therapy

## 2020-07-25 ENCOUNTER — Telehealth: Payer: Self-pay | Admitting: *Deleted

## 2020-07-25 NOTE — Telephone Encounter (Signed)
Patient left a voicemail stating that he has a cold and has been tested for covid,  Patient stated that the covid test was negative. Tried to call patient back and was unable to leave a message because his voicemail has not yet been set up. Need more information.

## 2020-07-28 ENCOUNTER — Encounter: Payer: Commercial Managed Care - PPO | Admitting: Physical Therapy

## 2020-07-28 NOTE — Telephone Encounter (Signed)
noted 

## 2020-07-28 NOTE — Telephone Encounter (Signed)
Tried to call patient again and unable to leave message because his voicemail has not yet been set up. Will wait for patient to call the office back.

## 2020-07-31 ENCOUNTER — Encounter: Payer: Commercial Managed Care - PPO | Admitting: Physical Therapy

## 2020-08-04 ENCOUNTER — Encounter: Payer: Commercial Managed Care - PPO | Admitting: Physical Therapy

## 2020-08-06 ENCOUNTER — Encounter: Payer: Commercial Managed Care - PPO | Admitting: Physical Therapy

## 2020-08-11 ENCOUNTER — Encounter: Payer: Commercial Managed Care - PPO | Admitting: Physical Therapy

## 2020-08-14 ENCOUNTER — Encounter: Payer: Commercial Managed Care - PPO | Admitting: Physical Therapy

## 2020-08-18 ENCOUNTER — Encounter: Payer: Commercial Managed Care - PPO | Admitting: Physical Therapy

## 2020-08-21 ENCOUNTER — Encounter: Payer: Commercial Managed Care - PPO | Admitting: Physical Therapy

## 2020-08-26 NOTE — Telephone Encounter (Signed)
Pt had appt on 06/05/20.

## 2020-10-03 DIAGNOSIS — F411 Generalized anxiety disorder: Secondary | ICD-10-CM | POA: Insufficient documentation

## 2021-04-03 ENCOUNTER — Emergency Department
Admission: EM | Admit: 2021-04-03 | Discharge: 2021-04-03 | Discharge status: Against Medical Advice | Payer: Commercial Managed Care - PPO

## 2021-05-23 ENCOUNTER — Other Ambulatory Visit: Payer: Self-pay

## 2021-05-23 ENCOUNTER — Emergency Department
Admission: EM | Admit: 2021-05-23 | Discharge: 2021-05-23 | Disposition: A | Discharge status: Home / Self Care | Payer: Self-pay | Attending: Emergency Medicine | Admitting: Emergency Medicine

## 2021-05-23 DIAGNOSIS — F17210 Nicotine dependence, cigarettes, uncomplicated: Secondary | ICD-10-CM | POA: Insufficient documentation

## 2021-05-23 DIAGNOSIS — K0889 Other specified disorders of teeth and supporting structures: Secondary | ICD-10-CM | POA: Insufficient documentation

## 2021-05-23 MED ORDER — TRAMADOL HCL 50 MG PO TABS: 50.0000 mg | ORAL_TABLET | Freq: Three times a day (TID) | ORAL | 0 refills | Status: DC | PRN

## 2021-05-23 MED ORDER — AMOXICILLIN 500 MG PO CAPS
500.0000 mg | ORAL_CAPSULE | Freq: Once | ORAL | Status: AC
  Administered 2021-05-23: 500 mg via ORAL
  Filled 2021-05-23: qty 1

## 2021-05-23 MED ORDER — KETOROLAC TROMETHAMINE 30 MG/ML IJ SOLN
30.0000 mg | Freq: Once | INTRAMUSCULAR | Status: AC
  Administered 2021-05-23: 30 mg via INTRAMUSCULAR
  Filled 2021-05-23: qty 1

## 2021-05-23 MED ORDER — AMOXICILLIN 500 MG PO CAPS: 500.0000 mg | ORAL_CAPSULE | Freq: Three times a day (TID) | ORAL | 0 refills | Status: DC

## 2021-05-23 NOTE — ED Triage Notes (Signed)
Pt states he has a dentist appointment on Monday but he cannot tolerate pain with OTC medication. Left lower jaw is painful, pt is unsure if cavity or infection. Pt requesting pain medication. AOX4, NAD noted. No swelling noted.

## 2021-05-23 NOTE — ED Provider Notes (Signed)
[facility] Emergency Department Provider Note ____________________________________________  Time seen: 2015  I have reviewed the triage vital signs and the nursing notes.  HISTORY  Chief Complaint  Dental Pain   HPI Donald Austin is a 60 y.o. male presents to the ER today with complaint of left lower tooth pain.  He reports this started 4 days ago.  He describes the pain as a constant throbbing pain he is having difficulty chewing and even drinking fluids due to the sensitivity.  He has not noticed any jaw swelling or difficulty swallowing.  He denies fever, chills or body aches.  He reports he has a dentist appointment on Monday but could not wait.  He has taken Tylenol 1000 g every 8 hours and Ibuprofen 800 mg every 8 hours with no relief of symptoms.    Past Medical History:  Diagnosis Date   Anemia    NOS   GERD (gastroesophageal reflux disease)    History of Helicobacter pylori infection     Patient Active Problem List   Diagnosis Date Noted   Erectile dysfunction 07/13/2018   ANEMIA-NOS 12/25/2010   Peripheral vascular disease ([place]) 06/09/2007   GERD 06/09/2007    Past Surgical History:  Procedure Laterality Date   COLONOSCOPY  2012   fatty tissue removed from neck  2004    Prior to Admission medications   Medication Sig Start Date End Date Taking? Authorizing Provider  amoxicillin (AMOXIL) 500 MG capsule Take 1 capsule (500 mg total) by mouth 3 (three) times daily. 05/23/21  Yes Tailor Lucking, Coralie Keens, NP  traMADol (ULTRAM) 50 MG tablet Take 1 tablet (50 mg total) by mouth every 8 (eight) hours as needed. 05/23/21 05/23/22 Yes Gurkirat Basher, Coralie Keens, NP  cyclobenzaprine (FLEXERIL) 10 MG tablet Take 1 tablet (10 mg total) by mouth 3 (three) times daily as needed. Patient not taking: Reported on 06/05/2020 01/23/20   Sable Feil, PA-C  ketorolac (TORADOL) 10 MG tablet Take 1 tablet (10 mg total) by mouth every 6 (six) hours as needed. Patient not  taking: Reported on 06/05/2020 01/23/20   Sable Feil, PA-C  methocarbamol (ROBAXIN) 500 MG tablet Take 1 tablet (500 mg total) by mouth every 8 (eight) hours as needed for muscle spasms. 06/05/20   Jearld Fenton, NP  ondansetron (ZOFRAN) 8 MG tablet Take 1 tablet (8 mg total) by mouth every 8 (eight) hours as needed for nausea or vomiting. Patient not taking: Reported on 06/05/2020 01/23/20   Sable Feil, PA-C  pantoprazole (PROTONIX) 40 MG tablet Take 1 tablet (40 mg total) by mouth 2 (two) times daily before a meal. 09/13/19   Cook, Barnie Del, DO  predniSONE (DELTASONE) 10 MG tablet Take 3 tabs on days 1-3, take 2 tabs on days 4-6, take 1 tab on days 7-9 06/05/20   Jearld Fenton, NP  tadalafil (CIALIS) 20 MG tablet TAKE 1 TABLET BY MOUTH AS NEEDED 01/09/20   Alyson Ingles Candee Furbish, MD    Allergies Patient has no known allergies.  Family History  Problem Relation Age of Onset   Hypertension Father    Kidney failure Father    Hypertension Mother    Cancer Sister        breast   Hypertension Sister    Hypertension Sister    Hypertension Brother    Hypertension Brother    Heart attack Neg Hx        <55    Social History Social History  Tobacco Use   Smoking status: Every Day    Packs/day: 1.00    Types: Cigarettes   Smokeless tobacco: Never   Tobacco comments:    quit 4 days ago, 20pyh  Vaping Use   Vaping Use: Former  Substance Use Topics   Alcohol use: Yes    Alcohol/week: 4.0 standard drinks    Types: 4 Cans of beer per week   Drug use: Not Currently    Types: Marijuana    Review of Systems  Constitutional: Negative for fever, chills or body aches. ENT: Positive for dental pain.  Negative for sore throat or difficulty swallowing. Cardiovascular: Negative for chest pain or chest tightness. Respiratory: Negative for cough or shortness of breath. ____________________________________________  PHYSICAL EXAM:  VITAL SIGNS: ED Triage Vitals  Enc Vitals Group      BP 05/23/21 1903 (!) 157/111     Pulse Rate 05/23/21 1903 62     Resp 05/23/21 1903 16     Temp 05/23/21 1903 98.8 F (37.1 C)     Temp Source 05/23/21 1903 Oral     SpO2 05/23/21 1903 100 %     Weight 05/23/21 1904 190 lb (86.2 kg)     Height 05/23/21 1904 6' (1.829 m)     Head Circumference --      Peak Flow --      Pain Score 05/23/21 1903 10     Pain Loc --      Pain Edu? --      Excl. in [place]? --     Constitutional: Alert and oriented.  Appears in pain but in no distress. Head: Normocephalic. Mouth/Throat: Mucous membranes are moist.  Multiple broken teeth noted.  Tooth #18 with exposed nerve at the gumline.  Swelling noted of the gum on the left side. Hematological/Lymphatic/Immunological: No cervical lymphadenopathy. Cardiovascular: Normal rate, regular rhythm.  Respiratory: Normal respiratory effort. No wheezes/rales/rhonchi. Neurologic: Normal speech and language. No gross focal neurologic deficits are appreciated.  ____________________________________________  INITIAL IMPRESSION / ASSESSMENT AND PLAN / ED COURSE  Dental Pain:  Exposed nerve vs infection Toradol 30 mg IM x 1 Will cover with Amoxicillin 500 mg PO TID, first dose given in ER RX for Tramadol 50 mg PO Q8H prn x 3 days  Encouraged him to continue to alternate Ibuprofen and Tylenol OTC Follow up with dentist as planned      I reviewed the patient's prescription history over the last 12 months in the multi-state controlled substances database(s) that includes [place], [place], [place], [place], [place], [place], [place], [place], [place], [place], [place], [place], [place], and [place].  Results were notable for no recent controlled substances. ____________________________________________  FINAL CLINICAL IMPRESSION(S) / ED DIAGNOSES  Final diagnoses:  Pain, dental      Jearld Fenton, NP 05/23/21 2028    Lucrezia Starch, MD 05/23/21 2255

## 2021-05-23 NOTE — Discharge Instructions (Addendum)
You were seen today for dental pain.  You received a Toradol injection in the ER.  I am giving you prescription for antibiotics to take 3 times daily for the next 10 days.  I am giving you a limited supply of tramadol to take as needed for severe pain.  Please continue to alternate ibuprofen and Tylenol OTC.  Salt water gargles may help.  Please follow-up with your dentist for further evaluation of symptoms.

## 2021-08-05 ENCOUNTER — Other Ambulatory Visit: Payer: Self-pay

## 2021-08-05 ENCOUNTER — Ambulatory Visit (INDEPENDENT_AMBULATORY_CARE_PROVIDER_SITE_OTHER): Payer: Self-pay | Admitting: Urology

## 2021-08-05 ENCOUNTER — Encounter: Payer: Self-pay | Admitting: Urology

## 2021-08-05 VITALS — BP 130/82 | HR 78 | Ht 73.0 in | Wt 189.0 lb

## 2021-08-05 DIAGNOSIS — N5201 Erectile dysfunction due to arterial insufficiency: Secondary | ICD-10-CM

## 2021-08-05 MED ORDER — SILDENAFIL CITRATE 50 MG PO TABS: ORAL_TABLET | ORAL | 0 refills | Status: DC

## 2021-08-05 NOTE — Progress Notes (Signed)
08/05/2021 3:51 PM   Donald Austin 04/30/1961 [id number]  Referring provider: Jearld Fenton, NP [address] [place],  [place] [address]  Chief Complaint  Patient presents with   Erectile Dysfunction    HPI: Donald Austin is a 60 y.o. male who presents for evaluation of erectile dysfunction.  Duration ~ 3 years Complains of difficulty achieving erection and times his erections are firm enough for penetration are much less than 50%.  The rare times he has been able to achieve penetration he is unable to maintain the erection No pain or curvature with erection Organic risk factors spinal stenosis, peripheral vascular disease and several year history of tobacco use which he was unable to quantify at 1 pack/day Tadalafil at 20 mg not effective No decreased libido, tiredness or fatigue PSA 09/2020 0.7   PMH: Past Medical History:  Diagnosis Date   Anemia    NOS   GERD (gastroesophageal reflux disease)    History of Helicobacter pylori infection     Surgical History: Past Surgical History:  Procedure Laterality Date   COLONOSCOPY  2012   fatty tissue removed from neck  2004    Home Medications:  Allergies as of 08/05/2021   No Known Allergies      Medication List        Accurate as of August 05, 2021  3:51 PM. If you have any questions, ask your nurse or doctor.          STOP taking these medications    amoxicillin 500 MG capsule Commonly known as: AMOXIL Stopped by: Abbie Sons, MD   cyclobenzaprine 10 MG tablet Commonly known as: FLEXERIL Stopped by: Abbie Sons, MD       TAKE these medications    ketorolac 10 MG tablet Commonly known as: TORADOL Take 1 tablet (10 mg total) by mouth every 6 (six) hours as needed.   methocarbamol 500 MG tablet Commonly known as: Robaxin Take 1 tablet (500 mg total) by mouth every 8 (eight) hours as needed for muscle spasms.   ondansetron 8 MG tablet Commonly known as: Zofran Take 1 tablet  (8 mg total) by mouth every 8 (eight) hours as needed for nausea or vomiting.   pantoprazole 40 MG tablet Commonly known as: Protonix Take 1 tablet (40 mg total) by mouth 2 (two) times daily before a meal.   predniSONE 10 MG tablet Commonly known as: DELTASONE Take 3 tabs on days 1-3, take 2 tabs on days 4-6, take 1 tab on days 7-9   sildenafil 50 MG tablet Commonly known as: VIAGRA Take 1 tab 1 hour prior to intercourse  can increase to two tab as needed. Started by: Abbie Sons, MD   tadalafil 20 MG tablet Commonly known as: CIALIS TAKE 1 TABLET BY MOUTH AS NEEDED   traMADol 50 MG tablet Commonly known as: Ultram Take 1 tablet (50 mg total) by mouth every 8 (eight) hours as needed.        Allergies: No Known Allergies  Family History: Family History  Problem Relation Age of Onset   Hypertension Father    Kidney failure Father    Hypertension Mother    Cancer Sister        breast   Hypertension Sister    Hypertension Sister    Hypertension Brother    Hypertension Brother    Heart attack Neg Hx        <55    Social History:  reports  that he has been smoking cigarettes. He has been smoking an average of 1 pack per day. He has never used smokeless tobacco. He reports current alcohol use of about 4.0 standard drinks per week. He reports that he does not currently use drugs after having used the following drugs: Marijuana.   Physical Exam: BP 130/82   Pulse 78   Ht 6\' 1"  (1.854 m)   Wt 189 lb (85.7 kg)   BMI 24.94 kg/m   Constitutional:  Alert and oriented, No acute distress. HEENT: [place] AT, moist mucus membranes.  Trachea midline, no masses. Cardiovascular: No clubbing, cyanosis, or edema. Respiratory: Normal respiratory effort, no increased work of breathing. Psychiatric: Normal mood and affect.   Assessment & Plan:    1.  Erectile dysfunction Positive organic risk factors Failed tadalafil 20 mg We discussed that PDE 5 inhibitors have equal efficacy  and unlikely sildenafil would be any more effective Second line options were discussed including vacuum erection devices and intracavernosal injections.  Each were discussed in detail and he was provided literature on intracavernosal injections He would like to think over these options but did request a trial of sildenafil-Rx was sent to pharmacy He will call back if interested in pursuing intracavernosal injections   Abbie Sons, MD  [facility] [address] [place], [place] [address] [phone]

## 2021-08-25 ENCOUNTER — Other Ambulatory Visit: Payer: Self-pay | Admitting: Urology

## 2021-10-02 ENCOUNTER — Other Ambulatory Visit: Payer: Self-pay | Admitting: Urology

## 2021-10-09 ENCOUNTER — Ambulatory Visit: Payer: Self-pay | Admitting: Internal Medicine

## 2021-10-16 ENCOUNTER — Other Ambulatory Visit: Payer: Self-pay | Admitting: Urology

## 2021-10-31 ENCOUNTER — Other Ambulatory Visit: Payer: Self-pay | Admitting: Urology

## 2021-11-03 ENCOUNTER — Other Ambulatory Visit: Payer: Self-pay | Admitting: Urology

## 2021-12-09 ENCOUNTER — Other Ambulatory Visit: Payer: Self-pay | Admitting: Urology

## 2021-12-09 ENCOUNTER — Ambulatory Visit: Payer: Self-pay

## 2021-12-09 NOTE — Telephone Encounter (Signed)
°  Chief Complaint: numbness Symptoms: LLE numbness from foot to calf and occasional up to buttocks Frequency: going on several months Pertinent Negatives: Patient denies pain Disposition: [] ED /[] Urgent Care (no appt availability in office) / [x] Appointment(In office/virtual)/ []  [facility]/ [] Home Care/ [] Refused Recommended Disposition /[] [facility]/ []  Follow-up with PCP Additional Notes: it causes unsteadiness at times. Pt needed appt farther out d/t work schedule.    Reason for Disposition  Numbness in a leg or foot (i.e., loss of sensation)  Answer Assessment - Initial Assessment Questions 1. ONSET: "When did the pain start?"      Going on few months 2. LOCATION: "Where is the pain located?"      L foot to L calf and occasional buttocks 3. PAIN: "How bad is the pain?"    (Scale 1-10; or mild, moderate, severe)   -  MILD (1-3): doesn't interfere with normal activities    -  MODERATE (4-7): interferes with normal activities (e.g., work or school) or awakens from sleep, limping    -  SEVERE (8-10): excruciating pain, unable to do any normal activities, unable to walk     0 6. OTHER SYMPTOMS: "Do you have any other symptoms?" (e.g., chest pain, back pain, breathing difficulty, swelling, rash, fever, numbness, weakness)     Numbness, unsteadiness at times  Protocols used: Leg Pain-A-AH

## 2021-12-15 ENCOUNTER — Ambulatory Visit: Payer: Self-pay | Admitting: Internal Medicine

## 2021-12-15 NOTE — Progress Notes (Unsigned)
Subjective:    Patient ID: Donald Austin, male    DOB: 02-18-1961, 61 y.o.   MRN: [id number]  HPI  Patient presents to clinic today with complaint of numbness in his left leg.  This is a chronic issue for him, diagnosed with lumbar radiculopathy in the past.  MRI lumbar spine from 2019 showed:  IMPRESSION: Severe spinal stenosis at L3-4 L4-5 with progression since 2011. Stenosis most severe at L4-5. Mild spinal stenosis L2-3 with left lateral small disc protrusion.    Review of Systems     Past Medical History:  Diagnosis Date   Anemia    NOS   GERD (gastroesophageal reflux disease)    History of Helicobacter pylori infection     Current Outpatient Medications  Medication Sig Dispense Refill   ketorolac (TORADOL) 10 MG tablet Take 1 tablet (10 mg total) by mouth every 6 (six) hours as needed. 20 tablet 0   methocarbamol (ROBAXIN) 500 MG tablet Take 1 tablet (500 mg total) by mouth every 8 (eight) hours as needed for muscle spasms. 20 tablet 0   ondansetron (ZOFRAN) 8 MG tablet Take 1 tablet (8 mg total) by mouth every 8 (eight) hours as needed for nausea or vomiting. 20 tablet 0   pantoprazole (PROTONIX) 40 MG tablet Take 1 tablet (40 mg total) by mouth 2 (two) times daily before a meal. 60 tablet 3   predniSONE (DELTASONE) 10 MG tablet Take 3 tabs on days 1-3, take 2 tabs on days 4-6, take 1 tab on days 7-9 18 tablet 0   sildenafil (VIAGRA) 50 MG tablet TAKE 1 TABLET PRIOR TO INTERCOURSE. MAY INCREASE TO 2 TABLETS IF NEEDED. 10 tablet 1   traMADol (ULTRAM) 50 MG tablet Take 1 tablet (50 mg total) by mouth every 8 (eight) hours as needed. 10 tablet 0   No current facility-administered medications for this visit.    No Known Allergies  Family History  Problem Relation Age of Onset   Hypertension Father    Kidney failure Father    Hypertension Mother    Cancer Sister        breast   Hypertension Sister    Hypertension Sister    Hypertension Brother     Hypertension Brother    Heart attack Neg Hx        <55    Social History   Socioeconomic History   Marital status: Married    Spouse name: Not on file   Number of children: 3   Years of education: Not on file   Highest education level: Not on file  Occupational History   Occupation: Ecologist    Comment: [employer]  Tobacco Use   Smoking status: Every Day    Packs/day: 1.00    Types: Cigarettes   Smokeless tobacco: Never   Tobacco comments:    quit 4 days ago, 20pyh  Vaping Use   Vaping Use: Former  Substance and Sexual Activity   Alcohol use: Yes    Alcohol/week: 4.0 standard drinks    Types: 4 Cans of beer per week   Drug use: Not Currently    Types: Marijuana   Sexual activity: Not on file  Other Topics Concern   Not on file  Social History Narrative   Regular exercise--no   Children 3 healthy   Diet: Avoids fried food, baked, occ. Fruits and veggies   Social Determinants of Health   Financial Resource Strain: Not on file  Food Insecurity: Not on  file  Transportation Needs: Not on file  Physical Activity: Not on file  Stress: Not on file  Social Connections: Not on file  Intimate Partner Violence: Not on file     Constitutional: Denies fever, malaise, fatigue, headache or abrupt weight changes.  HEENT: Denies eye pain, eye redness, ear pain, ringing in the ears, wax buildup, runny nose, nasal congestion, bloody nose, or sore throat. Respiratory: Denies difficulty breathing, shortness of breath, cough or sputum production.   Cardiovascular: Denies chest pain, chest tightness, palpitations or swelling in the hands or feet.  Gastrointestinal: Denies abdominal pain, bloating, constipation, diarrhea or blood in the stool.  GU: Denies urgency, frequency, pain with urination, burning sensation, blood in urine, odor or discharge. Musculoskeletal: Denies decrease in range of motion, difficulty with gait, muscle pain or joint pain and swelling.  Skin: Denies  redness, rashes, lesions or ulcercations.  Neurological: Patient reports numbness in his left leg.  Denies dizziness, difficulty with memory, difficulty with speech or problems with balance and coordination.  Psych: Denies anxiety, depression, SI/HI.  No other specific complaints in a complete review of systems (except as listed in HPI above).  Objective:   Physical Exam  There were no vitals taken for this visit. Wt Readings from Last 3 Encounters:  08/05/21 189 lb (85.7 kg)  05/23/21 190 lb (86.2 kg)  06/05/20 190 lb (86.2 kg)    General: Appears their stated age, well developed, well nourished in NAD. Skin: Warm, dry and intact. No rashes, lesions or ulcerations noted. HEENT: Head: normal shape and size; Eyes: sclera white, no icterus, conjunctiva pink, PERRLA and EOMs intact; Ears: Tm's gray and intact, normal light reflex; Nose: mucosa pink and moist, septum midline; Throat/Mouth: Teeth present, mucosa pink and moist, no exudate, lesions or ulcerations noted.  Neck:  Neck supple, trachea midline. No masses, lumps or thyromegaly present.  Cardiovascular: Normal rate and rhythm. S1,S2 noted.  No murmur, rubs or gallops noted. No JVD or BLE edema. No carotid bruits noted. Pulmonary/Chest: Normal effort and positive vesicular breath sounds. No respiratory distress. No wheezes, rales or ronchi noted.  Abdomen: Soft and nontender. Normal bowel sounds. No distention or masses noted. Liver, spleen and kidneys non palpable. Musculoskeletal: Normal range of motion. No signs of joint swelling. No difficulty with gait.  Neurological: Alert and oriented. Cranial nerves II-XII grossly intact. Coordination normal.  Psychiatric: Mood and affect normal. Behavior is normal. Judgment and thought content normal.    BMET    Component Value Date/Time   NA 138 03/02/2019 1032   K 4.0 03/02/2019 1032   CL 102 03/02/2019 1032   CO2 30 03/02/2019 1032   GLUCOSE 98 03/02/2019 1032   BUN 15 03/02/2019  1032   CREATININE 1.04 03/02/2019 1032   CALCIUM 9.2 03/02/2019 1032   GFRNONAA >60 05/01/2018 2221   GFRAA >60 05/01/2018 2221    Lipid Panel     Component Value Date/Time   CHOL 171 09/12/2018 1004   TRIG 75.0 09/12/2018 1004   HDL 33.90 (L) 09/12/2018 1004   CHOLHDL 5 09/12/2018 1004   VLDL 15.0 09/12/2018 1004   LDLCALC 122 (H) 09/12/2018 1004    CBC    Component Value Date/Time   WBC 6.3 03/02/2019 1032   RBC 5.19 03/02/2019 1032   HGB 15.9 03/02/2019 1032   HCT 46.3 03/02/2019 1032   PLT 233.0 03/02/2019 1032   MCV 89.2 03/02/2019 1032   MCH 30.0 05/01/2018 2221   MCHC 34.3 03/02/2019 1032  RDW 13.4 03/02/2019 1032   LYMPHSABS 2.5 12/25/2010 1643   MONOABS 0.7 12/25/2010 1643   EOSABS 0.1 12/25/2010 1643   BASOSABS 0.0 12/25/2010 1643    Hgb A1C No results found for: HGBA1C          Assessment & Plan:    Webb Silversmith, NP This visit occurred during the SARS-CoV-2 public health emergency.  Safety protocols were in place, including screening questions prior to the visit, additional usage of staff PPE, and extensive cleaning of exam room while observing appropriate contact time as indicated for disinfecting solutions.

## 2021-12-17 ENCOUNTER — Ambulatory Visit: Payer: Self-pay | Admitting: Internal Medicine

## 2022-01-12 ENCOUNTER — Other Ambulatory Visit: Payer: Self-pay

## 2022-01-12 ENCOUNTER — Encounter: Payer: Self-pay | Admitting: Internal Medicine

## 2022-01-12 ENCOUNTER — Ambulatory Visit (INDEPENDENT_AMBULATORY_CARE_PROVIDER_SITE_OTHER): Payer: Self-pay | Admitting: Internal Medicine

## 2022-01-12 VITALS — BP 172/108 | HR 65 | Temp 96.9°F | Wt 187.0 lb

## 2022-01-12 DIAGNOSIS — R202 Paresthesia of skin: Secondary | ICD-10-CM

## 2022-01-12 DIAGNOSIS — Z833 Family history of diabetes mellitus: Secondary | ICD-10-CM

## 2022-01-12 DIAGNOSIS — M79605 Pain in left leg: Secondary | ICD-10-CM

## 2022-01-12 DIAGNOSIS — M79604 Pain in right leg: Secondary | ICD-10-CM

## 2022-01-12 DIAGNOSIS — R1031 Right lower quadrant pain: Secondary | ICD-10-CM

## 2022-01-12 DIAGNOSIS — Z136 Encounter for screening for cardiovascular disorders: Secondary | ICD-10-CM

## 2022-01-12 DIAGNOSIS — F419 Anxiety disorder, unspecified: Secondary | ICD-10-CM

## 2022-01-12 DIAGNOSIS — I10 Essential (primary) hypertension: Secondary | ICD-10-CM

## 2022-01-12 DIAGNOSIS — F5104 Psychophysiologic insomnia: Secondary | ICD-10-CM

## 2022-01-12 MED ORDER — TRAMADOL HCL 50 MG PO TABS: 50.0000 mg | ORAL_TABLET | Freq: Three times a day (TID) | ORAL | 0 refills | Status: AC | PRN

## 2022-01-12 MED ORDER — METHOCARBAMOL 500 MG PO TABS: 500.0000 mg | ORAL_TABLET | Freq: Three times a day (TID) | ORAL | 0 refills | Status: AC | PRN

## 2022-01-12 MED ORDER — HYDROXYZINE PAMOATE 25 MG PO CAPS: 25.0000 mg | ORAL_CAPSULE | Freq: Every evening | ORAL | 0 refills | Status: AC | PRN

## 2022-01-12 NOTE — Progress Notes (Signed)
? ?Subjective:  ? ? Patient ID: Donald Austin, male    DOB: 09/02/1961, 61 y.o.   MRN: [id number] ? ?HPI ? ?Patient presents the clinic today with complaint of right lower abdominal pain.  This started a few months ago.  He describes the pain as sharp and stabbing.  The pain does not radiate.  It is worse with movement such as twisting or bending.  He reports it is not necessarily worse with lifting.  He has not noticed any bulges in the area.  He denies nausea, vomiting, reflux, constipation, diarrhea, blood in stool, urinary urgency, frequency, dysuria, blood in his urine, testicular pain or swelling.  He has not tried anything OTC for this. ? ?Of note, his BP today is 168/108.  He has never been diagnosed with HTN in the past.  He reports his pain is likely the cause of his elevated blood pressure.  He is not taking any antihypertensive medications at this time. ? ?He also reports intermittent paresthesias of his lower extremities, L >R.  He reports there are times when his left leg just seems to give out on him.  He denies any low back pain, loss of bowel or bladder control.  He has never had any back surgeries.  He has not tried anything OTC for this. ? ?He also reports anxiety.  He reports this started after the loss of his wife last year.  This is causing him to have difficulty falling and staying asleep.  He is not taking any medications OTC for this. ? ?Review of Systems ? ?Past Medical History:  ?Diagnosis Date  ? Anemia   ? NOS  ? GERD (gastroesophageal reflux disease)   ? History of Helicobacter pylori infection   ? ? ?Current Outpatient Medications  ?Medication Sig Dispense Refill  ? pantoprazole (PROTONIX) 40 MG tablet Take 1 tablet (40 mg total) by mouth 2 (two) times daily before a meal. 60 tablet 3  ? sildenafil (VIAGRA) 50 MG tablet TAKE 1 TABLET PRIOR TO INTERCOURSE. MAY INCREASE TO 2 TABLETS IF NEEDED. 10 tablet 1  ? traMADol (ULTRAM) 50 MG tablet Take 1 tablet (50 mg total) by mouth every 8  (eight) hours as needed. 10 tablet 0  ? ?No current facility-administered medications for this visit.  ? ? ?No Known Allergies ? ?Family History  ?Problem Relation Age of Onset  ? Hypertension Father   ? Kidney failure Father   ? Hypertension Mother   ? Cancer Sister   ?     breast  ? Hypertension Sister   ? Hypertension Sister   ? Hypertension Brother   ? Hypertension Brother   ? Heart attack Neg Hx   ?     <55  ? ? ?Social History  ? ?Socioeconomic History  ? Marital status: Married  ?  Spouse name: Not on file  ? Number of children: 3  ? Years of education: Not on file  ? Highest education level: Not on file  ?Occupational History  ? 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 Smoking status: Every Day  ?  Packs/day: 1.00  ?  Types: Cigarettes  ? Smokeless tobacco: Never  ? Tobacco comments:  ?  quit 4 days ago, 20pyh  ?Vaping Use  ? Vaping Use: Former  ?Substance and Sexual Activity  ? Alcohol use: Yes  ?  Alcohol/week: 4.0 standard drinks  ?  Types: 4 Cans of beer per week  ? Drug use:  Not Currently  ?  Types: Marijuana  ? Sexual activity: Not on file  ?Other Topics Concern  ? Not on file  ?Social History Narrative  ? Regular exercise--no  ? Children 3 healthy  ? Diet: Avoids fried food, baked, occ. Fruits and veggies  ? ?Social Determinants of Health  ? ?Financial Resource Strain: Not on file  ?Food Insecurity: Not on file  ?Transportation Needs: Not on file  ?Physical Activity: Not on file  ?Stress: Not on file  ?Social Connections: Not on file  ?Intimate Partner Violence: Not on file  ? ? ? ?Constitutional: Denies fever, malaise, fatigue, headache or abrupt weight changes.  ?Respiratory: Denies difficulty breathing, shortness of breath, cough or sputum production.   ?Cardiovascular: Denies chest pain, chest tightness, palpitations or swelling in the hands or feet.  ?Gastrointestinal: Patient reports right lower quadrant abdominal pain.  Denies bloating, constipation, diarrhea or blood  in the stool.  ?GU: Denies urgency, frequency, pain with urination, burning sensation, blood in urine, odor or discharge. ?Musculoskeletal: Denies decrease in range of motion, difficulty with gait, muscle pain or joint pain and swelling.  ?Skin: Denies redness, rashes, lesions or ulcercations.  ?Neurological: Patient reports insomnia and paresthesias of lower extremities.  Denies dizziness, difficulty with memory, difficulty with speech or problems with balance and coordination.  ?Psych: Patient reports anxiety.  Denies depression, SI/HI. ? ?No other specific complaints in a complete review of systems (except as listed in HPI above). ? ?   ?Objective:  ? Physical Exam ? ?BP (!) 172/108 (BP Location: Left Arm, Patient Position: Sitting, Cuff Size: Normal)   Pulse 65   Temp (!) 96.9 ?F (36.1 ?C) (Temporal)   Wt 187 lb (84.8 kg)   SpO2 100%   BMI 24.67 kg/m?  ? ?Wt Readings from Last 3 Encounters:  ?08/05/21 189 lb (85.7 kg)  ?05/23/21 190 lb (86.2 kg)  ?06/05/20 190 lb (86.2 kg)  ? ? ?General: Appears his stated age, well developed, well nourished in NAD. ?Skin: Warm, dry and intact. No rashes noted. ?HEENT: Head: normal shape and size; Eyes: sclera white, PERRLA and EOMs intact;  ?Cardiovascular: Normal rate and rhythm. S1,S2 noted.  No murmur, rubs or gallops noted. No JVD or BLE edema.  ?Pulmonary/Chest: Normal effort and positive vesicular breath sounds. No respiratory distress. No wheezes, rales or ronchi noted.  ?Abdomen: Soft and tender in the RLQ/right groin. Normal bowel sounds. No distention or masses noted. Liver, spleen and kidneys non palpable. ?Musculoskeletal: Normal flexion, extension, rotation and lateral bending of the spine.  No bony tenderness noted over the spine or SI joints.  Strength 5/5 RLE, 4/5 LLE.  No difficulty with gait.  ?Neurological: Alert and oriented.  ?Psychiatric: Mood and affect normal.  Mildly anxious appearing.  Judgment and thought content normal.  ? ? ?BMET ?   ?Component  Value Date/Time  ? NA 138 03/02/2019 1032  ? K 4.0 03/02/2019 1032  ? CL 102 03/02/2019 1032  ? CO2 30 03/02/2019 1032  ? GLUCOSE 98 03/02/2019 1032  ? BUN 15 03/02/2019 1032  ? CREATININE 1.04 03/02/2019 1032  ? CALCIUM 9.2 03/02/2019 1032  ? GFRNONAA >60 05/01/2018 2221  ? GFRAA >60 05/01/2018 2221  ? ? ?Lipid Panel  ?   ?Component Value Date/Time  ? CHOL 171 09/12/2018 1004  ? TRIG 75.0 09/12/2018 1004  ? HDL 33.90 (L) 09/12/2018 1004  ? CHOLHDL 5 09/12/2018 1004  ? VLDL 15.0 09/12/2018 1004  ? LDLCALC 122 (H) 09/12/2018 1004  ? ? ?  CBC ?   ?Component Value Date/Time  ? WBC 6.3 03/02/2019 1032  ? RBC 5.19 03/02/2019 1032  ? HGB 15.9 03/02/2019 1032  ? HCT 46.3 03/02/2019 1032  ? PLT 233.0 03/02/2019 1032  ? MCV 89.2 03/02/2019 1032  ? MCH 30.0 05/01/2018 2221  ? MCHC 34.3 03/02/2019 1032  ? RDW 13.4 03/02/2019 1032  ? LYMPHSABS 2.5 12/25/2010 1643  ? MONOABS 0.7 12/25/2010 1643  ? EOSABS 0.1 12/25/2010 1643  ? BASOSABS 0.0 12/25/2010 1643  ? ? ?Hgb A1C ?No results found for: HGBA1C ? ? ? ? ? ?   ?Assessment & Plan:  ? ?Paresthesia of Lower Extremities: ? ?Since we are obtaining a CT abdomen/pelvis, will obtain CT L-spine, no charge ?Encourage regular stretching ?Would consider PT versus MRI pending CT results ?Rx for Tramadol 50 mg every 8 hours as needed ?Rx for Methocarbamol 500 mg every 8 hours as needed-sedation caution given ? ?RLQ Abdominal Pain: ? ?Concern for right inguinal hernia ?BMETtoday ?CT abdomen/pelvis with contrast ? ?Anxiety/Insomnia: ?Hydroxyzine 25 mg at bedtime ?Support offered ? ?We will follow-up after labs and imaging with further recommendation and treatment plan ?Webb Silversmith, NP ?This visit occurred during the SARS-CoV-2 public health emergency.  Safety protocols were in place, including screening questions prior to the visit, additional usage of staff PPE, and extensive cleaning of exam room while observing appropriate contact time as indicated for disinfecting solutions. ' ?

## 2022-01-13 ENCOUNTER — Encounter: Payer: Self-pay | Admitting: Internal Medicine

## 2022-01-13 DIAGNOSIS — I10 Essential (primary) hypertension: Secondary | ICD-10-CM | POA: Insufficient documentation

## 2022-01-13 LAB — COMPLETE METABOLIC PANEL WITH GFR
AG Ratio: 1.3 (calc) (ref 1.0–2.5)
ALT: 24 U/L (ref 9–46)
AST: 19 U/L (ref 10–35)
Albumin: 4.3 g/dL (ref 3.6–5.1)
Alkaline phosphatase (APISO): 84 U/L (ref 35–144)
BUN: 13 mg/dL (ref 7–25)
CO2: 24 mmol/L (ref 20–32)
Calcium: 9.4 mg/dL (ref 8.6–10.3)
Chloride: 108 mmol/L (ref 98–110)
Creat: 0.97 mg/dL (ref 0.70–1.30)
Globulin: 3.2 g/dL (calc) (ref 1.9–3.7)
Glucose, Bld: 80 mg/dL (ref 65–139)
Potassium: 4.4 mmol/L (ref 3.5–5.3)
Sodium: 142 mmol/L (ref 135–146)
Total Bilirubin: 0.5 mg/dL (ref 0.2–1.2)
Total Protein: 7.5 g/dL (ref 6.1–8.1)
eGFR: 90 mL/min/{1.73_m2} (ref >=60)

## 2022-01-13 LAB — CBC
HCT: 48.2 % (ref 38.5–50.0)
Hemoglobin: 16.2 g/dL (ref 13.2–17.1)
MCH: 30 pg (ref 27.0–33.0)
MCHC: 33.6 g/dL (ref 32.0–36.0)
MCV: 89.3 fL (ref 80.0–100.0)
MPV: 10 fL (ref 7.5–12.5)
Platelets: 255 10*3/uL (ref 140–400)
RBC: 5.4 10*6/uL (ref 4.20–5.80)
RDW: 12.3 % (ref 11.0–15.0)
WBC: 6.3 10*3/uL (ref 3.8–10.8)

## 2022-01-13 LAB — LIPID PANEL
Cholesterol: 166 mg/dL (ref <200)
HDL: 48 mg/dL (ref >=40)
LDL Cholesterol (Calc): 105 mg/dL (calc) — ABNORMAL HIGH
Non-HDL Cholesterol (Calc): 118 mg/dL (calc) (ref <130)
Total CHOL/HDL Ratio: 3.5 (calc) (ref <5.0)
Triglycerides: 52 mg/dL (ref <150)

## 2022-01-13 LAB — HEMOGLOBIN A1C
Hgb A1c MFr Bld: 5.7 % of total Hgb — ABNORMAL HIGH (ref <5.7)
Mean Plasma Glucose: 117 mg/dL
eAG (mmol/L): 6.5 mmol/L

## 2022-01-13 NOTE — Patient Instructions (Signed)

## 2022-01-13 NOTE — Assessment & Plan Note (Signed)
Patient feels this is pain related ?Reinforced DASH diet ? ?RTC in 2 weeks for follow-up HTN, if remains elevated would start antihypertensive medication at that time ?

## 2022-01-18 ENCOUNTER — Other Ambulatory Visit: Payer: Self-pay | Admitting: Urology

## 2022-01-18 MED ORDER — SILDENAFIL CITRATE 50 MG PO TABS: ORAL_TABLET | ORAL | 1 refills | Status: DC

## 2022-01-18 NOTE — Telephone Encounter (Signed)
Patient called requesting refill for sildenafil sent to [facility], states refill request was denied. Last seen 10/22 please call or send in explain denial.  ? ?

## 2022-01-18 NOTE — Telephone Encounter (Signed)
Medications sent

## 2022-01-19 ENCOUNTER — Ambulatory Visit: Admission: RE | Admit: 2022-01-19 | Payer: PRIVATE HEALTH INSURANCE | Source: Ambulatory Visit

## 2022-01-29 ENCOUNTER — Ambulatory Visit: Payer: Self-pay | Admitting: Internal Medicine

## 2022-01-29 ENCOUNTER — Ambulatory Visit: Admission: RE | Admit: 2022-01-29 | Payer: PRIVATE HEALTH INSURANCE | Source: Ambulatory Visit

## 2022-01-29 DIAGNOSIS — R7303 Prediabetes: Secondary | ICD-10-CM | POA: Insufficient documentation

## 2022-01-29 NOTE — Progress Notes (Deleted)
? ?Subjective:  ? ? Patient ID: Donald Austin, male    DOB: 12-11-1960, 61 y.o.   MRN: [id number] ? ?HPI ? ?Patient presents to clinic today for follow-up of HTN.  At his last visit, his blood pressure was elevated which he felt was pain related.  He was not started on antihypertensive medication at this time.  His BP today is. ? ?Review of Systems ? ?   ?Past Medical History:  ?Diagnosis Date  ? Anemia   ? NOS  ? GERD (gastroesophageal reflux disease)   ? History of Helicobacter pylori infection   ? ? ?Current Outpatient Medications  ?Medication Sig Dispense Refill  ? hydrOXYzine (VISTARIL) 25 MG capsule Take 1 capsule (25 mg total) by mouth at bedtime as needed. 30 capsule 0  ? methocarbamol (ROBAXIN) 500 MG tablet Take 1 tablet (500 mg total) by mouth every 8 (eight) hours as needed for muscle spasms. 15 tablet 0  ? sildenafil (VIAGRA) 50 MG tablet TAKE 1 TABLET PRIOR TO INTERCOURSE. MAY INCREASE TO 2 TABLETS IF NEEDED 10 tablet 1  ? traMADol (ULTRAM) 50 MG tablet Take 1 tablet (50 mg total) by mouth every 8 (eight) hours as needed. 15 tablet 0  ? ?No current facility-administered medications for this visit.  ? ? ?No Known Allergies ? ?Family History  ?Problem Relation Age of Onset  ? Hypertension Father   ? Kidney failure Father   ? Hypertension Mother   ? Cancer Sister   ?     breast  ? Hypertension Sister   ? Hypertension Sister   ? Hypertension Brother   ? Hypertension Brother   ? Heart attack Neg Hx   ?     <55  ? ? ?Social History  ? ?Socioeconomic History  ? Marital status: Married  ?  Spouse name: Not on file  ? Number of children: 3  ? Years of education: Not on file  ? Highest education level: Not on file  ?Occupational History  ? 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 Smoking status: Every Day  ?  Packs/day: 1.00  ?  Types: Cigarettes  ? Smokeless tobacco: Never  ? Tobacco comments:  ?  quit 4 days ago, 20pyh  ?Vaping Use  ? Vaping Use: Former  ?Substance and Sexual  Activity  ? Alcohol use: Yes  ?  Alcohol/week: 4.0 standard drinks  ?  Types: 4 Cans of beer per week  ? Drug use: Not Currently  ?  Types: Marijuana  ? Sexual activity: Not on file  ?Other Topics Concern  ? Not on file  ?Social History Narrative  ? Regular exercise--no  ? Children 3 healthy  ? Diet: Avoids fried food, baked, occ. Fruits and veggies  ? ?Social Determinants of Health  ? ?Financial Resource Strain: Not on file  ?Food Insecurity: Not on file  ?Transportation Needs: Not on file  ?Physical Activity: Not on file  ?Stress: Not on file  ?Social Connections: Not on file  ?Intimate Partner Violence: Not on file  ? ? ? ?Constitutional: Denies fever, malaise, fatigue, headache or abrupt weight changes.  ?HEENT: Denies eye pain, eye redness, ear pain, ringing in the ears, wax buildup, runny nose, nasal congestion, bloody nose, or sore throat. ?Respiratory: Denies difficulty breathing, shortness of breath, cough or sputum production.   ?Cardiovascular: Denies chest pain, chest tightness, palpitations or swelling in the hands or feet.  ?Gastrointestinal: Denies abdominal pain, bloating, constipation, diarrhea or blood  in the stool.  ?GU: Denies urgency, frequency, pain with urination, burning sensation, blood in urine, odor or discharge. ?Musculoskeletal: Denies decrease in range of motion, difficulty with gait, muscle pain or joint pain and swelling.  ?Skin: Denies redness, rashes, lesions or ulcercations.  ?Neurological: Denies dizziness, difficulty with memory, difficulty with speech or problems with balance and coordination.  ?Psych: Denies anxiety, depression, SI/HI. ? ?No other specific complaints in a complete review of systems (except as listed in HPI above). ? ?Objective:  ? Physical Exam ? ? ?There were no vitals taken for this visit. ?Wt Readings from Last 3 Encounters:  ?01/12/22 187 lb (84.8 kg)  ?08/05/21 189 lb (85.7 kg)  ?05/23/21 190 lb (86.2 kg)  ? ? ?General: Appears their stated age, well  developed, well nourished in NAD. ?Skin: Warm, dry and intact. No rashes, lesions or ulcerations noted. ?HEENT: Head: normal shape and size; Eyes: sclera white, no icterus, conjunctiva pink, PERRLA and EOMs intact; Ears: Tm's gray and intact, normal light reflex; Nose: mucosa pink and moist, septum midline; Throat/Mouth: Teeth present, mucosa pink and moist, no exudate, lesions or ulcerations noted.  ?Neck:  Neck supple, trachea midline. No masses, lumps or thyromegaly present.  ?Cardiovascular: Normal rate and rhythm. S1,S2 noted.  No murmur, rubs or gallops noted. No JVD or BLE edema. No carotid bruits noted. ?Pulmonary/Chest: Normal effort and positive vesicular breath sounds. No respiratory distress. No wheezes, rales or ronchi noted.  ?Abdomen: Soft and nontender. Normal bowel sounds. No distention or masses noted. Liver, spleen and kidneys non palpable. ?Musculoskeletal: Normal range of motion. No signs of joint swelling. No difficulty with gait.  ?Neurological: Alert and oriented. Cranial nerves II-XII grossly intact. Coordination normal.  ?Psychiatric: Mood and affect normal. Behavior is normal. Judgment and thought content normal.  ? ?BMET ?   ?Component Value Date/Time  ? NA 142 01/12/2022 1600  ? K 4.4 01/12/2022 1600  ? CL 108 01/12/2022 1600  ? CO2 24 01/12/2022 1600  ? GLUCOSE 80 01/12/2022 1600  ? BUN 13 01/12/2022 1600  ? CREATININE 0.97 01/12/2022 1600  ? CALCIUM 9.4 01/12/2022 1600  ? GFRNONAA >60 05/01/2018 2221  ? GFRAA >60 05/01/2018 2221  ? ? ?Lipid Panel  ?   ?Component Value Date/Time  ? CHOL 166 01/12/2022 1600  ? TRIG 52 01/12/2022 1600  ? HDL 48 01/12/2022 1600  ? CHOLHDL 3.5 01/12/2022 1600  ? VLDL 15.0 09/12/2018 1004  ? LDLCALC 105 (H) 01/12/2022 1600  ? ? ?CBC ?   ?Component Value Date/Time  ? WBC 6.3 01/12/2022 1600  ? RBC 5.40 01/12/2022 1600  ? HGB 16.2 01/12/2022 1600  ? HCT 48.2 01/12/2022 1600  ? PLT 255 01/12/2022 1600  ? MCV 89.3 01/12/2022 1600  ? MCH 30.0 01/12/2022 1600  ?  MCHC 33.6 01/12/2022 1600  ? RDW 12.3 01/12/2022 1600  ? LYMPHSABS 2.5 12/25/2010 1643  ? MONOABS 0.7 12/25/2010 1643  ? EOSABS 0.1 12/25/2010 1643  ? BASOSABS 0.0 12/25/2010 1643  ? ? ?Hgb A1C ?Lab Results  ?Component Value Date  ? HGBA1C 5.7 (H) 01/12/2022  ? ? ? ? ? ? ?   ?Assessment & Plan:  ? ? ?Webb Silversmith, NP ? ? ?

## 2022-02-12 ENCOUNTER — Ambulatory Visit: Admission: RE | Admit: 2022-02-12 | Payer: PRIVATE HEALTH INSURANCE | Source: Ambulatory Visit

## 2022-02-12 ENCOUNTER — Ambulatory Visit: Payer: Self-pay | Admitting: Internal Medicine

## 2022-02-12 NOTE — Progress Notes (Deleted)
Subjective:    Patient ID: Donald Austin, male    DOB: 20-Aug-1961, 61 y.o.   MRN: [id number]  HPI  Patient presents to clinic today for follow-up of elevated blood pressure.  His blood pressure at his last visit was 172/108.  He felt this was pain related.  His blood pressure today is.  He has never been diagnosed with HTN.  Review of Systems     Past Medical History:  Diagnosis Date   Anemia    NOS   GERD (gastroesophageal reflux disease)    History of Helicobacter pylori infection     Current Outpatient Medications  Medication Sig Dispense Refill   hydrOXYzine (VISTARIL) 25 MG capsule Take 1 capsule (25 mg total) by mouth at bedtime as needed. 30 capsule 0   methocarbamol (ROBAXIN) 500 MG tablet Take 1 tablet (500 mg total) by mouth every 8 (eight) hours as needed for muscle spasms. 15 tablet 0   sildenafil (VIAGRA) 50 MG tablet TAKE 1 TABLET PRIOR TO INTERCOURSE. MAY INCREASE TO 2 TABLETS IF NEEDED 10 tablet 1   traMADol (ULTRAM) 50 MG tablet Take 1 tablet (50 mg total) by mouth every 8 (eight) hours as needed. 15 tablet 0   No current facility-administered medications for this visit.    No Known Allergies  Family History  Problem Relation Age of Onset   Hypertension Father    Kidney failure Father    Hypertension Mother    Cancer Sister        breast   Hypertension Sister    Hypertension Sister    Hypertension Brother    Hypertension Brother    Heart attack Neg Hx        <55    Social History   Socioeconomic History   Marital status: Married    Spouse name: Not on file   Number of children: 3   Years of education: Not on file   Highest education level: Not on file  Occupational History   Occupation: Ecologist    Comment: [employer]  Tobacco Use   Smoking status: Every Day    Packs/day: 1.00    Types: Cigarettes   Smokeless tobacco: Never   Tobacco comments:    quit 4 days ago, 20pyh  Vaping Use   Vaping Use: Former  Substance and  Sexual Activity   Alcohol use: Yes    Alcohol/week: 4.0 standard drinks    Types: 4 Cans of beer per week   Drug use: Not Currently    Types: Marijuana   Sexual activity: Not on file  Other Topics Concern   Not on file  Social History Narrative   Regular exercise--no   Children 3 healthy   Diet: Avoids fried food, baked, occ. Fruits and veggies   Social Determinants of Health   Financial Resource Strain: Not on file  Food Insecurity: Not on file  Transportation Needs: Not on file  Physical Activity: Not on file  Stress: Not on file  Social Connections: Not on file  Intimate Partner Violence: Not on file     Constitutional: Denies fever, malaise, fatigue, headache or abrupt weight changes.  HEENT: Denies eye pain, eye redness, ear pain, ringing in the ears, wax buildup, runny nose, nasal congestion, bloody nose, or sore throat. Respiratory: Denies difficulty breathing, shortness of breath, cough or sputum production.   Cardiovascular: Denies chest pain, chest tightness, palpitations or swelling in the hands or feet.  Gastrointestinal: Denies abdominal pain, bloating, constipation, diarrhea or  blood in the stool.  GU: Denies urgency, frequency, pain with urination, burning sensation, blood in urine, odor or discharge. Musculoskeletal: Denies decrease in range of motion, difficulty with gait, muscle pain or joint pain and swelling.  Skin: Denies redness, rashes, lesions or ulcercations.  Neurological: Denies dizziness, difficulty with memory, difficulty with speech or problems with balance and coordination.  Psych: Denies anxiety, depression, SI/HI.  No other specific complaints in a complete review of systems (except as listed in HPI above).  Objective:   Physical Exam  There were no vitals taken for this visit. Wt Readings from Last 3 Encounters:  01/12/22 187 lb (84.8 kg)  08/05/21 189 lb (85.7 kg)  05/23/21 190 lb (86.2 kg)    General: Appears their stated age, well  developed, well nourished in NAD. Skin: Warm, dry and intact. No rashes, lesions or ulcerations noted. HEENT: Head: normal shape and size; Eyes: sclera white, no icterus, conjunctiva pink, PERRLA and EOMs intact; Ears: Tm's gray and intact, normal light reflex; Nose: mucosa pink and moist, septum midline; Throat/Mouth: Teeth present, mucosa pink and moist, no exudate, lesions or ulcerations noted.  Neck:  Neck supple, trachea midline. No masses, lumps or thyromegaly present.  Cardiovascular: Normal rate and rhythm. S1,S2 noted.  No murmur, rubs or gallops noted. No JVD or BLE edema. No carotid bruits noted. Pulmonary/Chest: Normal effort and positive vesicular breath sounds. No respiratory distress. No wheezes, rales or ronchi noted.  Abdomen: Soft and nontender. Normal bowel sounds. No distention or masses noted. Liver, spleen and kidneys non palpable. Musculoskeletal: Normal range of motion. No signs of joint swelling. No difficulty with gait.  Neurological: Alert and oriented. Cranial nerves II-XII grossly intact. Coordination normal.  Psychiatric: Mood and affect normal. Behavior is normal. Judgment and thought content normal.     BMET    Component Value Date/Time   NA 142 01/12/2022 1600   K 4.4 01/12/2022 1600   CL 108 01/12/2022 1600   CO2 24 01/12/2022 1600   GLUCOSE 80 01/12/2022 1600   BUN 13 01/12/2022 1600   CREATININE 0.97 01/12/2022 1600   CALCIUM 9.4 01/12/2022 1600   GFRNONAA >60 05/01/2018 2221   GFRAA >60 05/01/2018 2221    Lipid Panel     Component Value Date/Time   CHOL 166 01/12/2022 1600   TRIG 52 01/12/2022 1600   HDL 48 01/12/2022 1600   CHOLHDL 3.5 01/12/2022 1600   VLDL 15.0 09/12/2018 1004   LDLCALC 105 (H) 01/12/2022 1600    CBC    Component Value Date/Time   WBC 6.3 01/12/2022 1600   RBC 5.40 01/12/2022 1600   HGB 16.2 01/12/2022 1600   HCT 48.2 01/12/2022 1600   PLT 255 01/12/2022 1600   MCV 89.3 01/12/2022 1600   MCH 30.0 01/12/2022 1600    MCHC 33.6 01/12/2022 1600   RDW 12.3 01/12/2022 1600   LYMPHSABS 2.5 12/25/2010 1643   MONOABS 0.7 12/25/2010 1643   EOSABS 0.1 12/25/2010 1643   BASOSABS 0.0 12/25/2010 1643    Hgb A1C Lab Results  Component Value Date   HGBA1C 5.7 (H) 01/12/2022            Assessment & Plan:    Webb Silversmith, NP

## 2022-03-08 ENCOUNTER — Ambulatory Visit: Payer: Self-pay | Admitting: Internal Medicine

## 2022-03-08 NOTE — Progress Notes (Deleted)
Subjective:    Patient ID: Donald Austin, male    DOB: 1961-08-13, 61 y.o.   MRN: [id number]  HPI  Patient presents to clinic today for follow-up of HTN.  At his last visit, his BP was elevated.  He felt this was pain related.  He is not currently taking any antihypertensive medications at this time.  His BP today is.  Review of Systems     Past Medical History:  Diagnosis Date   Anemia    NOS   GERD (gastroesophageal reflux disease)    History of Helicobacter pylori infection     Current Outpatient Medications  Medication Sig Dispense Refill   hydrOXYzine (VISTARIL) 25 MG capsule Take 1 capsule (25 mg total) by mouth at bedtime as needed. 30 capsule 0   methocarbamol (ROBAXIN) 500 MG tablet Take 1 tablet (500 mg total) by mouth every 8 (eight) hours as needed for muscle spasms. 15 tablet 0   sildenafil (VIAGRA) 50 MG tablet TAKE 1 TABLET PRIOR TO INTERCOURSE. MAY INCREASE TO 2 TABLETS IF NEEDED 10 tablet 1   traMADol (ULTRAM) 50 MG tablet Take 1 tablet (50 mg total) by mouth every 8 (eight) hours as needed. 15 tablet 0   No current facility-administered medications for this visit.    No Known Allergies  Family History  Problem Relation Age of Onset   Hypertension Father    Kidney failure Father    Hypertension Mother    Cancer Sister        breast   Hypertension Sister    Hypertension Sister    Hypertension Brother    Hypertension Brother    Heart attack Neg Hx        <55    Social History   Socioeconomic History   Marital status: Married    Spouse name: Not on file   Number of children: 3   Years of education: Not on file   Highest education level: Not on file  Occupational History   Occupation: Ecologist    Comment: [employer]  Tobacco Use   Smoking status: Every Day    Packs/day: 1.00    Types: Cigarettes   Smokeless tobacco: Never   Tobacco comments:    quit 4 days ago, 20pyh  Vaping Use   Vaping Use: Former  Substance and Sexual  Activity   Alcohol use: Yes    Alcohol/week: 4.0 standard drinks    Types: 4 Cans of beer per week   Drug use: Not Currently    Types: Marijuana   Sexual activity: Not on file  Other Topics Concern   Not on file  Social History Narrative   Regular exercise--no   Children 3 healthy   Diet: Avoids fried food, baked, occ. Fruits and veggies   Social Determinants of Health   Financial Resource Strain: Not on file  Food Insecurity: Not on file  Transportation Needs: Not on file  Physical Activity: Not on file  Stress: Not on file  Social Connections: Not on file  Intimate Partner Violence: Not on file     Constitutional: Denies fever, malaise, fatigue, headache or abrupt weight changes.  HEENT: Denies eye pain, eye redness, ear pain, ringing in the ears, wax buildup, runny nose, nasal congestion, bloody nose, or sore throat. Respiratory: Denies difficulty breathing, shortness of breath, cough or sputum production.   Cardiovascular: Denies chest pain, chest tightness, palpitations or swelling in the hands or feet.  Gastrointestinal: Denies abdominal pain, bloating, constipation, diarrhea or  blood in the stool.  GU: Denies urgency, frequency, pain with urination, burning sensation, blood in urine, odor or discharge. Musculoskeletal: Denies decrease in range of motion, difficulty with gait, muscle pain or joint pain and swelling.  Skin: Denies redness, rashes, lesions or ulcercations.  Neurological: Denies dizziness, difficulty with memory, difficulty with speech or problems with balance and coordination.  Psych: Denies anxiety, depression, SI/HI.  No other specific complaints in a complete review of systems (except as listed in HPI above).  Objective:   Physical Exam   There were no vitals taken for this visit. Wt Readings from Last 3 Encounters:  01/12/22 187 lb (84.8 kg)  08/05/21 189 lb (85.7 kg)  05/23/21 190 lb (86.2 kg)    General: Appears their stated age, well  developed, well nourished in NAD. Skin: Warm, dry and intact. No rashes, lesions or ulcerations noted. HEENT: Head: normal shape and size; Eyes: sclera white, no icterus, conjunctiva pink, PERRLA and EOMs intact; Ears: Tm's gray and intact, normal light reflex; Nose: mucosa pink and moist, septum midline; Throat/Mouth: Teeth present, mucosa pink and moist, no exudate, lesions or ulcerations noted.  Neck:  Neck supple, trachea midline. No masses, lumps or thyromegaly present.  Cardiovascular: Normal rate and rhythm. S1,S2 noted.  No murmur, rubs or gallops noted. No JVD or BLE edema. No carotid bruits noted. Pulmonary/Chest: Normal effort and positive vesicular breath sounds. No respiratory distress. No wheezes, rales or ronchi noted.  Abdomen: Soft and nontender. Normal bowel sounds. No distention or masses noted. Liver, spleen and kidneys non palpable. Musculoskeletal: Normal range of motion. No signs of joint swelling. No difficulty with gait.  Neurological: Alert and oriented. Cranial nerves II-XII grossly intact. Coordination normal.  Psychiatric: Mood and affect normal. Behavior is normal. Judgment and thought content normal.     BMET    Component Value Date/Time   NA 142 01/12/2022 1600   K 4.4 01/12/2022 1600   CL 108 01/12/2022 1600   CO2 24 01/12/2022 1600   GLUCOSE 80 01/12/2022 1600   BUN 13 01/12/2022 1600   CREATININE 0.97 01/12/2022 1600   CALCIUM 9.4 01/12/2022 1600   GFRNONAA >60 05/01/2018 2221   GFRAA >60 05/01/2018 2221    Lipid Panel     Component Value Date/Time   CHOL 166 01/12/2022 1600   TRIG 52 01/12/2022 1600   HDL 48 01/12/2022 1600   CHOLHDL 3.5 01/12/2022 1600   VLDL 15.0 09/12/2018 1004   LDLCALC 105 (H) 01/12/2022 1600    CBC    Component Value Date/Time   WBC 6.3 01/12/2022 1600   RBC 5.40 01/12/2022 1600   HGB 16.2 01/12/2022 1600   HCT 48.2 01/12/2022 1600   PLT 255 01/12/2022 1600   MCV 89.3 01/12/2022 1600   MCH 30.0 01/12/2022 1600    MCHC 33.6 01/12/2022 1600   RDW 12.3 01/12/2022 1600   LYMPHSABS 2.5 12/25/2010 1643   MONOABS 0.7 12/25/2010 1643   EOSABS 0.1 12/25/2010 1643   BASOSABS 0.0 12/25/2010 1643    Hgb A1C Lab Results  Component Value Date   HGBA1C 5.7 (H) 01/12/2022           Assessment & Plan:    Webb Silversmith, NP

## 2022-04-02 DIAGNOSIS — Z125 Encounter for screening for malignant neoplasm of prostate: Secondary | ICD-10-CM | POA: Diagnosis not present

## 2022-04-02 DIAGNOSIS — I10 Essential (primary) hypertension: Secondary | ICD-10-CM | POA: Diagnosis not present

## 2022-04-02 DIAGNOSIS — E119 Type 2 diabetes mellitus without complications: Secondary | ICD-10-CM | POA: Diagnosis not present

## 2022-04-02 DIAGNOSIS — Z1329 Encounter for screening for other suspected endocrine disorder: Secondary | ICD-10-CM | POA: Diagnosis not present

## 2022-04-02 DIAGNOSIS — Z13220 Encounter for screening for lipoid disorders: Secondary | ICD-10-CM | POA: Diagnosis not present

## 2022-04-26 ENCOUNTER — Telehealth: Payer: Self-pay | Admitting: *Deleted

## 2022-04-26 ENCOUNTER — Other Ambulatory Visit: Payer: Self-pay | Admitting: Urology

## 2022-04-26 NOTE — Telephone Encounter (Signed)
Pt calling asking about refill, refill sent, Left message on machine that patient needs to schedule appointment.

## 2022-06-09 ENCOUNTER — Ambulatory Visit: Payer: Self-pay

## 2022-06-09 NOTE — Telephone Encounter (Signed)
  Chief Complaint: Bilateral leg numbness Symptoms: Numbness in both legs ongoing for at least 6 months, left leg more so Frequency: 6 months or longer Pertinent Negatives: Patient denies SOB, Chest pain Disposition: '[]'$ ED /'[x]'$ Urgent Care (no appt availability in office) / '[]'$ Appointment(In office/virtual)/ '[]'$  [facility]/ '[]'$ Home Care/ '[]'$ Refused Recommended Disposition /'[]'$ [facility]/ '[]'$  Follow-up with PCP Additional Notes: PT has had leg numbness for at least 6 months. Pt states that left leg is more numb than right.  Pt states that he thinks it's a back or neck problem.  Problem does somewhat resolve when he sits down for a few minutes.  Reason for Disposition  [1] Numbness or tingling in one or both feet AND [2] is a chronic symptom (recurrent or ongoing AND present > 4 weeks)  Answer Assessment - Initial Assessment Questions 1. SYMPTOM: "What is the main symptom you are concerned about?" (e.g., weakness, numbness)     Waist down legs are numb - left leg from feet to knee 2. ONSET: "When did this start?" (minutes, hours, days; while sleeping)     months 3. LAST NORMAL: "When was the last time you (the patient) were normal (no symptoms)?"     6 months 4. PATTERN "Does this come and go, or has it been constant since it started?"  "Is it present now?"     Constant - knee to feet does not go away 5. CARDIAC SYMPTOMS: "Have you had any of the following symptoms: chest pain, difficulty breathing, palpitations?"     Anxiety - Rush in chest 6. NEUROLOGIC SYMPTOMS: "Have you had any of the following symptoms: headache, dizziness, vision loss, double vision, changes in speech, unsteady on your feet?"     Balance off - gets better when he sits down for a few minutes. 7. OTHER SYMPTOMS: "Do you have any other symptoms?"     Throbbing in ankle  - left side. 8. PREGNANCY: "Is there any chance you are pregnant?" "When was your last menstrual period?"     na  Protocols used:  Neurologic Deficit-A-AH

## 2023-05-25 ENCOUNTER — Telehealth: Payer: Self-pay

## 2023-05-25 NOTE — Telephone Encounter (Signed)
LVM for patient to call back [phone], or to call PCP office to schedule follow up apt. AS, CMA  

## 2023-11-21 DIAGNOSIS — J069 Acute upper respiratory infection, unspecified: Secondary | ICD-10-CM | POA: Diagnosis not present

## 2023-11-21 DIAGNOSIS — Z20822 Contact with and (suspected) exposure to covid-19: Secondary | ICD-10-CM | POA: Diagnosis not present

## 2024-08-17 NOTE — Progress Notes (Deleted)
 Referring Physician:  Antonette Angeline ORN, NP [address] [place],  [place] [address]  Primary Physician:  Antonette Angeline ORN, NP  History of Present Illness: 08/17/2024*** Mr. Christiaan Strebeck has a history of HTN, prediabetes.   Low back pain   Numbness in both legs? Any pain in the legs?  Previous lumbar MRI in 2019 showed severe spinal stenosis L3-L4 < L4-L5 with mild central stenosis L2-L3.   Duration: *** Location: *** Quality: *** Severity: ***  Precipitating: aggravated by *** Modifying factors: made better by *** Weakness: none Timing: ***  Tobacco use: smokes 1 PPD x *** years.   Bowel/Bladder Dysfunction: none  Conservative measures:  Physical therapy: *** has not participated in within the last year? Multimodal medical therapy including regular antiinflammatories: ***naproxen, flexeril , robaxin   Injections: ***no epidural steroid injections?  Past Surgery: ***no spinal surgeries?  Lonni KATHEE Cohens has ***no symptoms of cervical myelopathy.  The symptoms are causing a significant impact on the patient's life.   Review of Systems:  A 10 point review of systems is negative, except for the pertinent positives and negatives detailed in the HPI.  Past Medical History: Past Medical History:  Diagnosis Date   Anemia    NOS   GERD (gastroesophageal reflux disease)    History of Helicobacter pylori infection     Past Surgical History: Past Surgical History:  Procedure Laterality Date   COLONOSCOPY  2012   fatty tissue removed from neck  2004    Allergies: Allergies as of 08/23/2024   (No Known Allergies)    Medications: Outpatient Encounter Medications as of 08/23/2024  Medication Sig   hydrOXYzine  (VISTARIL ) 25 MG capsule Take 1 capsule (25 mg total) by mouth at bedtime as needed.   methocarbamol  (ROBAXIN ) 500 MG tablet Take 1 tablet (500 mg total) by mouth every 8 (eight) hours as needed for muscle spasms.   sildenafil  (VIAGRA ) 50 MG tablet TAKE  1 TABLET PRIOR TO INTERCOURSE . MAY INCREASE TO 2 TABLETS IF NEEDED   No facility-administered encounter medications on file as of 08/23/2024.    Social History: Social History   Tobacco Use   Smoking status: Every Day    Current packs/day: 1.00    Types: Cigarettes   Smokeless tobacco: Never   Tobacco comments:    quit 4 days ago, 20pyh  Vaping Use   Vaping status: Former  Substance Use Topics   Alcohol use: Yes    Alcohol/week: 4.0 standard drinks of alcohol    Types: 4 Cans of beer per week   Drug use: Not Currently    Types: Marijuana    Family Medical History: Family History  Problem Relation Age of Onset   Hypertension Father    Kidney failure Father    Hypertension Mother    Cancer Sister        breast   Hypertension Sister    Hypertension Sister    Hypertension Brother    Hypertension Brother    Heart attack Neg Hx        <55    Physical Examination: There were no vitals filed for this visit.  General: Patient is well developed, well nourished, calm, collected, and in no apparent distress. Attention to examination is appropriate.  Respiratory: Patient is breathing without any difficulty.   NEUROLOGICAL:     Awake, alert, oriented to person, place, and time.  Speech is clear and fluent. Fund of knowledge is appropriate.   Cranial Nerves: Pupils equal round and reactive  to light.  Facial tone is symmetric.    *** ROM of cervical spine *** pain *** posterior cervical tenderness. *** tenderness in bilateral trapezial region.   *** ROM of lumbar spine *** pain *** posterior lumbar tenderness.   No abnormal lesions on exposed skin.   Strength: Side Biceps Triceps Deltoid Interossei Grip Wrist Ext. Wrist Flex.  R 5 5 5 5 5 5 5   L 5 5 5 5 5 5 5    Side Iliopsoas Quads Hamstring PF DF EHL  R 5 5 5 5 5 5   L 5 5 5 5 5 5    Reflexes are ***2+ and symmetric at the biceps, brachioradialis, patella and achilles.   Hoffman's is absent.  Clonus is not  present.   Bilateral upper and lower extremity sensation is intact to light touch.     Gait is normal.   ***No difficulty with tandem gait.    Medical Decision Making  Imaging: No recent lumbar imaging.   Assessment and Plan: Mr. Claxton is a pleasant 63 y.o. male has ***  Treatment options discussed with patient and following plan made:   - Order for physical therapy for *** spine ***. Patient to call to schedule appointment. *** - Continue current medications including ***. Reviewed dosing and side effects.  - Prescription for ***. Reviewed dosing and side effects. Take with food.  - Prescription for *** to take prn muscle spasms. Reviewed dosing and side effects. Discussed this can cause drowsiness.  - MRI of *** to further evaluate *** radiculopathy. No improvement time or medications (***).  - Referral to PMR at [facility] to discuss possible *** injections.  - Will schedule phone visit to review MRI results once I get them back.   I spent a total of *** minutes in face-to-face and non-face-to-face activities related to this patient's care today including review of outside records, review of imaging, review of symptoms, physical exam, discussion of differential diagnosis, discussion of treatment options, and documentation.   Thank you for involving me in the care of this patient.   Glade Boys PA-C Dept. of Neurosurgery

## 2024-08-23 ENCOUNTER — Ambulatory Visit: Payer: PRIVATE HEALTH INSURANCE | Admitting: Orthopedic Surgery
